# Patient Record
Sex: Female | Born: 1950 | Race: White | Hispanic: No | Smoking: Never smoker
Health system: Southern US, Community
[De-identification: ages and names within clinical notes are randomized; demographics above are authoritative.]

## PROBLEM LIST (undated history)

## (undated) HISTORY — PX: BACK SURGERY: SHX140

---

## 1998-10-17 ENCOUNTER — Other Ambulatory Visit: Admission: RE | Admit: 1998-10-17 | Discharge: 1998-10-17 | Payer: Self-pay | Admitting: Gynecology

## 1999-09-04 ENCOUNTER — Other Ambulatory Visit: Admission: RE | Admit: 1999-09-04 | Discharge: 1999-09-04 | Payer: Self-pay | Admitting: Gynecology

## 1999-09-04 ENCOUNTER — Encounter: Admission: RE | Admit: 1999-09-04 | Discharge: 1999-09-04 | Payer: Self-pay | Admitting: Gynecology

## 1999-09-04 ENCOUNTER — Encounter: Payer: Self-pay | Admitting: Gynecology

## 1999-09-21 ENCOUNTER — Ambulatory Visit (HOSPITAL_COMMUNITY): Admission: RE | Admit: 1999-09-21 | Discharge: 1999-09-21 | Payer: Self-pay | Admitting: Surgery

## 1999-09-21 ENCOUNTER — Encounter: Payer: Self-pay | Admitting: Surgery

## 2000-04-01 ENCOUNTER — Other Ambulatory Visit: Admission: RE | Admit: 2000-04-01 | Discharge: 2000-04-01 | Payer: Self-pay | Admitting: Gynecology

## 2000-05-14 ENCOUNTER — Encounter: Admission: RE | Admit: 2000-05-14 | Discharge: 2000-05-14 | Payer: Self-pay | Admitting: Surgery

## 2000-05-14 ENCOUNTER — Encounter: Payer: Self-pay | Admitting: Surgery

## 2002-04-05 ENCOUNTER — Other Ambulatory Visit: Admission: RE | Admit: 2002-04-05 | Discharge: 2002-04-05 | Payer: Self-pay | Admitting: Gynecology

## 2002-08-30 ENCOUNTER — Encounter: Admission: RE | Admit: 2002-08-30 | Discharge: 2002-08-30 | Payer: Self-pay | Admitting: Gynecology

## 2002-08-30 ENCOUNTER — Encounter: Payer: Self-pay | Admitting: Gynecology

## 2003-06-27 ENCOUNTER — Other Ambulatory Visit: Admission: RE | Admit: 2003-06-27 | Discharge: 2003-06-27 | Payer: Self-pay | Admitting: Gynecology

## 2004-06-26 ENCOUNTER — Other Ambulatory Visit: Admission: RE | Admit: 2004-06-26 | Discharge: 2004-06-26 | Payer: Self-pay | Admitting: Gynecology

## 2005-07-12 ENCOUNTER — Other Ambulatory Visit: Admission: RE | Admit: 2005-07-12 | Discharge: 2005-07-12 | Payer: Self-pay | Admitting: Gynecology

## 2006-09-21 ENCOUNTER — Encounter: Admission: RE | Admit: 2006-09-21 | Discharge: 2006-09-21 | Payer: Self-pay | Admitting: Internal Medicine

## 2006-09-30 ENCOUNTER — Encounter: Admission: RE | Admit: 2006-09-30 | Discharge: 2006-09-30 | Payer: Self-pay | Admitting: Internal Medicine

## 2006-10-08 ENCOUNTER — Other Ambulatory Visit: Admission: RE | Admit: 2006-10-08 | Discharge: 2006-10-08 | Payer: Self-pay | Admitting: Gynecology

## 2006-10-22 ENCOUNTER — Inpatient Hospital Stay (HOSPITAL_COMMUNITY): Admission: RE | Admit: 2006-10-22 | Discharge: 2006-10-25 | Payer: Self-pay | Admitting: Neurosurgery

## 2010-07-17 NOTE — Discharge Summary (Signed)
Alexandra Matthews             ACCOUNT NO.:  [id number]   MEDICAL RECORD NO.:  [id number]          PATIENT TYPE:  INP   LOCATION:  5153                         FACILITY:  [facility]   PHYSICIAN:  Danae Orleans. Venetia Maxon, M.D.  DATE OF BIRTH:  [birth year]   DATE OF ADMISSION:  10/22/2006  DATE OF DISCHARGE:  10/25/2006                               DISCHARGE SUMMARY   REASON FOR ADMISSION:  Alexandra Matthews is a 60 year old woman with back  and bilateral hip and leg pain consistent with neurogenic claudication.   HOSPITAL COURSE:  She failed medical management, was worked up with an  MRI scan of the lumbar spine which demonstrated spondylolisthesis of L4  and L5 with severe spinal stenosis.  It was elected for her to undergo  decompression and fusion at the L4-L5 level.  This was performed on  October 22, 2006.  She underwent uncomplicated surgery.  Postoperatively  she was gradually mobilized with physical therapy, and was doing well on  the 22nd.  On the 23rd she was up and ambulating, taking oral  analgesics, having good pain control, was using a rolling walker.   PLAN:  The plan is for her to be discharged home, to follow up with Dr.  Lovell Sheehan in the office in three weeks.   MEDICATIONS ON DISCHARGE:  1. Vivelle-Dot 0.05 mg one-half patch twice weekly.  2. Percocet 10/325 one every 4-6 hours as needed for pain.  3. Valium 5 mg every 6 hours as needed for muscular spasm.      Danae Orleans. Venetia Maxon, M.D.  Electronically Signed     JDS/MEDQ  D:  10/25/2006  T:  10/26/2006  Job:  [id number]

## 2010-07-17 NOTE — Op Note (Signed)
Alexandra Matthews, Alexandra Matthews             ACCOUNT NO.:  [id number]   MEDICAL RECORD NO.:  [id number]          PATIENT TYPE:  INP   LOCATION:  5153                         FACILITY:  [facility]   PHYSICIAN:  Cristi Loron, M.D.DATE OF BIRTH:  [birth year]   DATE OF PROCEDURE:  10/22/2006  DATE OF DISCHARGE:                               OPERATIVE REPORT   BRIEF HISTORY:  The patient is a 60 year old white female who suffered  from back and bilateral hip and leg pain consistent with neurogenic  claudication.  She failed medical management and was worked up with a  lumbar MRI which demonstrated the patient had a spondylolisthesis at L4-  L5 with severe spinal stenosis.  I discussed the various treatment  options with the patient.  She has weighed the risks, benefits and  alternatives of surgery and has decided to proceed with an L4-L5  decompression, instrumentation, and fusion.   PREOPERATIVE DIAGNOSIS:  L4-L5 grade 1 acquired spondylolisthesis,  superior facet arthropathy, spinal stenosis, spondylosis, degenerative  disc disease, lumbar radiculopathy, lumbago, lumbar scoliosis.   POSTOPERATIVE DIAGNOSIS:  L4-L5 grade 1 acquired spondylolisthesis,  superior facet arthropathy, spinal stenosis, spondylosis, degenerative  disc disease, lumbar radiculopathy, lumbago, lumbar scoliosis.   PROCEDURE PERFORMED:  Bilateral laminotomies and foraminotomies at L4 to  treat the severe spinal stenosis at L4-L5; L4-L5 transforaminal lumbar  interbody fusion with local morselized autograft bone and VITOSS bone  graft extender; insertion of L4-L5 interbody prosthesis (Capstone PEEK  interbody prosthesis); L4-L5 posterior nonsegmental instrumentation with  pedicle screws with Legacy titanium pedicle screws and rods; L4-L5  posterolateral arthrodesis with local morselized autograft bone and  VITOSS bone graft extender.   SURGEON:  Cristi Loron, M.D.   ASSISTANT:  Stefani Dama, M.D.   ANESTHESIA:   General endotracheal anesthesia.   ESTIMATED BLOOD LOSS:  250 mL.   SPECIMENS:  None.   DRAINS:  None.   COMPLICATIONS:  None.   DESCRIPTION OF PROCEDURE:  The patient was brought to the operating room  by the anesthesia team.  General endotracheal anesthesia was induced.  The patient was turned to the prone position on the Wilson frame.  The  lumbosacral region was then prepared with Betadine scrub and Betadine  solution.  Sterile drapes were applied.  I then injected the area to be  incised with Marcaine with epinephrine solution and used a scalpel to  make a linear midline incision over the L4-L5 interspace.  I used  electrocautery to perform a bilateral subperiosteal dissection exposing  the spinous process and lamina of L3, L4, L5, and the upper sacrum.  We  then obtained intraoperative radiograph to confirm our location and then  inserted the [facility] retractor for exposure.  We then began the  decompression by using a high speed drill to perform bilateral L4  laminotomies.  We widened the laminotomies with a Kerrison punch and  performed a foraminotomy about both the L4 and L5 nerve roots. Of note,  this decompression was in excess of what was needed to do the interbody  fusion, i.e., we had to perform the foraminotomies and medial  facetectomy  because of the severe stenosis.   Having completed the decompression, we now turned attention to  arthrodesis.  I removed the inferior facet of L4 on the right to gain  access to the more lateral aspects to the L4-L5 intervertebral disc  space.  We then incised the L4-L5 intervertebral disc and performed a  partial intervertebral discectomy using pituitary forceps and the  Epstein and Scoville curets.  We then prepared the vertebral endplates  for fusion by removing the soft tissue using the curets.  We used the  trial spacers and determined to use a 12 x 26 mm Capstone PEEK interbody  prosthesis.  We prefilled this prosthesis with  local autograft bone and  VITOSS as well as the anterior and posterior disc space with VITOSS and  local autograft bone. We inserted the prosthesis into the interspace, of  course, after retracting the neural structures out of harm's way.  We  turned partially sideways and noted there was a good snug fit of the  prosthesis in the interspace.  We filled the remainder of the interspace  with local autograft bone and VITOSS completing the transforaminal  lumbar interbody fusion.   We now turned attention to the instrumentation. Under fluoroscopic  guidance, we cannulated the bilateral L4 and L5 pedicles with the bone  probe.  We tapped the pedicles with a 5.5 mm tap.  We probed inside the  tapped pedicles to rule out cortical breeches.  We then inserted 6.5 x  50 mm pedicle screws bilaterally at L5 and 6.5 by 55 mm bilaterally at  L4.  We did this under fluoroscopic guidance. We then palpated along the  medial surface of the L4 and L5 pedicles and noted there was no cortical  breeches and the L4 and L5 nerve roots were not injured.  We then  connected the unilateral pedicle screws with a lordotic rod.  We  compressed the construct and then secured the rod in place by placing  the caps which we tightened appropriately completing the  instrumentation.   We now turned attention to posterolateral arthrodesis.  We used a high  speed drill to decorticate the remainder of the left L4-L5 facet pars  and transverse processes as well as the remainder of the superior facet  at L5 and the transverse process on the right.  We then laid a  combination of local morselized autograft bone and VITOSS bone graft  extender over these decorticated posterolateral structures completing  the posterolateral arthrodesis.   We then inspected the thecal sac and bilateral L4 and L5 nerve roots and  noted they were well decompressed.  We obtained hemostasis using bipolar  cautery.  We irrigated the wound out with  bacitracin solution and  removed the retractor.  We then reapproximated the patient's  thoracolumbar fascia with interrupted #1 Vicryl suture, subcutaneous  tissue with interrupted 2-0 Vicryl suture, and skin with Steri-Strips  and Benzoin.  The wound was then coated with bacitracin ointment, a  sterile dressing was applied, the drapes were removed.  The patient was  subsequently returned to the supine position where she was extubated by  the anesthesia team and transported to the post anesthesia care unit in  stable condition.  All sponge, instrument and needle counts were correct  at the end of the case.  I should mention that we also removed the  cephalad aspect of the L5 lamina to further decompress the L4-L5  interspace bilaterally.      Duane Lope.  Lovell Sheehan, M.D.  Electronically Signed     JDJ/MEDQ  D:  10/22/2006  T:  10/23/2006  Job:  [id number]

## 2010-12-14 LAB — COMPREHENSIVE METABOLIC PANEL
AST: 22
Albumin: 4.2
BUN: 15
CO2: 30
Calcium: 9.7
Chloride: 108
Creatinine, Ser: 1.01
GFR calc Af Amer: >60
GFR calc non Af Amer: 57 — ABNORMAL LOW
Total Bilirubin: 1.1

## 2010-12-14 LAB — CBC
HCT: 31.6 — ABNORMAL LOW
Hemoglobin: 10.8 — ABNORMAL LOW
MCV: 95.1
Platelets: 243
RBC: 4.3
WBC: 4.6
WBC: 6.2

## 2010-12-14 LAB — TYPE AND SCREEN
ABO/RH(D): O POS
Antibody Screen: NEGATIVE

## 2010-12-14 LAB — ABO/RH: ABO/RH(D): O POS

## 2010-12-14 LAB — BASIC METABOLIC PANEL
BUN: 12
Chloride: 101
GFR calc non Af Amer: >60
Potassium: 4.1
Sodium: 137

## 2011-06-12 ENCOUNTER — Other Ambulatory Visit: Payer: Self-pay | Admitting: Internal Medicine

## 2011-06-12 DIAGNOSIS — Z1231 Encounter for screening mammogram for malignant neoplasm of breast: Secondary | ICD-10-CM

## 2011-06-20 ENCOUNTER — Ambulatory Visit
Admission: RE | Admit: 2011-06-20 | Discharge: 2011-06-20 | Disposition: A | Discharge status: Home / Self Care | Payer: BC Managed Care – PPO | Source: Ambulatory Visit | Attending: Internal Medicine | Admitting: Internal Medicine

## 2011-06-20 DIAGNOSIS — Z1231 Encounter for screening mammogram for malignant neoplasm of breast: Secondary | ICD-10-CM

## 2012-05-20 ENCOUNTER — Other Ambulatory Visit: Payer: Self-pay | Admitting: Obstetrics and Gynecology

## 2012-05-20 ENCOUNTER — Other Ambulatory Visit (HOSPITAL_COMMUNITY)
Admission: RE | Admit: 2012-05-20 | Discharge: 2012-05-20 | Disposition: A | Discharge status: Home / Self Care | Payer: BC Managed Care – PPO | Source: Ambulatory Visit | Attending: Obstetrics and Gynecology | Admitting: Obstetrics and Gynecology

## 2012-05-20 DIAGNOSIS — Z1151 Encounter for screening for human papillomavirus (HPV): Secondary | ICD-10-CM | POA: Insufficient documentation

## 2012-05-20 DIAGNOSIS — Z01419 Encounter for gynecological examination (general) (routine) without abnormal findings: Secondary | ICD-10-CM | POA: Insufficient documentation

## 2014-06-28 ENCOUNTER — Other Ambulatory Visit: Payer: Self-pay

## 2014-06-28 DIAGNOSIS — Z1231 Encounter for screening mammogram for malignant neoplasm of breast: Secondary | ICD-10-CM

## 2014-07-04 ENCOUNTER — Encounter (INDEPENDENT_AMBULATORY_CARE_PROVIDER_SITE_OTHER): Payer: Self-pay

## 2014-07-04 ENCOUNTER — Ambulatory Visit
Admission: RE | Admit: 2014-07-04 | Discharge: 2014-07-04 | Disposition: A | Discharge status: Home / Self Care | Payer: BC Managed Care – PPO | Source: Ambulatory Visit

## 2014-07-04 DIAGNOSIS — Z1231 Encounter for screening mammogram for malignant neoplasm of breast: Secondary | ICD-10-CM

## 2017-06-26 ENCOUNTER — Other Ambulatory Visit: Payer: Self-pay | Admitting: Internal Medicine

## 2017-06-26 DIAGNOSIS — Z1231 Encounter for screening mammogram for malignant neoplasm of breast: Secondary | ICD-10-CM

## 2017-07-15 ENCOUNTER — Ambulatory Visit
Admission: RE | Admit: 2017-07-15 | Discharge: 2017-07-15 | Disposition: A | Discharge status: Home / Self Care | Payer: Medicare Other | Source: Ambulatory Visit | Attending: Internal Medicine | Admitting: Internal Medicine

## 2017-07-15 DIAGNOSIS — Z1231 Encounter for screening mammogram for malignant neoplasm of breast: Secondary | ICD-10-CM

## 2019-04-19 ENCOUNTER — Ambulatory Visit: Payer: Self-pay | Attending: Internal Medicine

## 2019-04-19 DIAGNOSIS — Z23 Encounter for immunization: Secondary | ICD-10-CM | POA: Insufficient documentation

## 2019-04-19 NOTE — Progress Notes (Signed)
   Covid-19 Vaccination Clinic  Name:  Alexandra Matthews    MRN: [id number] DOB: Aug 19, 1950  04/19/2019  [name] was observed post Covid-19 immunization for 15 minutes without incidence. She was provided with Vaccine Information Sheet and instruction to access the V-Safe system.   [name] was instructed to call 911 with any severe reactions post vaccine: Marland Kitchen Difficulty breathing  . Swelling of your face and throat  . A fast heartbeat  . A bad rash all over your body  . Dizziness and weakness    Immunizations Administered    Name Date Dose VIS Date Route   Moderna COVID-19 Vaccine 04/19/2019 11:28 AM 0.5 mL 02/02/2019 Intramuscular   Manufacturer: Moderna   Lot: [id number]   [id number]

## 2019-05-18 ENCOUNTER — Other Ambulatory Visit: Payer: Self-pay

## 2019-05-18 ENCOUNTER — Ambulatory Visit: Payer: Medicare PPO | Attending: Internal Medicine

## 2019-05-18 DIAGNOSIS — Z23 Encounter for immunization: Secondary | ICD-10-CM

## 2019-05-18 NOTE — Progress Notes (Signed)
   Covid-19 Vaccination Clinic  Name:  LENNIX LAMINACK    MRN: [id number] DOB: 05-05-1950  05/18/2019  Ms. Gislason was observed post Covid-19 immunization for 15 minutes without incident. She was provided with Vaccine Information Sheet and instruction to access the V-Safe system.   Ms. Troxell was instructed to call 911 with any severe reactions post vaccine: Marland Kitchen Difficulty breathing  . Swelling of face and throat  . A fast heartbeat  . A bad rash all over body  . Dizziness and weakness   Immunizations Administered    Name Date Dose VIS Date Route   Moderna COVID-19 Vaccine 05/18/2019 11:24 AM 0.5 mL 02/02/2019 Intramuscular   Manufacturer: Moderna   Lot: [id number]   [id number]

## 2019-08-06 ENCOUNTER — Other Ambulatory Visit: Payer: Self-pay | Admitting: Internal Medicine

## 2019-08-06 DIAGNOSIS — M8588 Other specified disorders of bone density and structure, other site: Secondary | ICD-10-CM

## 2019-08-06 DIAGNOSIS — Z1231 Encounter for screening mammogram for malignant neoplasm of breast: Secondary | ICD-10-CM

## 2019-09-07 DIAGNOSIS — E039 Hypothyroidism, unspecified: Secondary | ICD-10-CM | POA: Diagnosis not present

## 2019-09-13 DIAGNOSIS — H10413 Chronic giant papillary conjunctivitis, bilateral: Secondary | ICD-10-CM | POA: Diagnosis not present

## 2019-09-13 DIAGNOSIS — H04123 Dry eye syndrome of bilateral lacrimal glands: Secondary | ICD-10-CM | POA: Diagnosis not present

## 2019-10-26 ENCOUNTER — Ambulatory Visit
Admission: RE | Admit: 2019-10-26 | Discharge: 2019-10-26 | Disposition: A | Discharge status: Home / Self Care | Payer: Medicare PPO | Source: Ambulatory Visit | Attending: Internal Medicine | Admitting: Internal Medicine

## 2019-10-26 ENCOUNTER — Other Ambulatory Visit: Payer: Self-pay

## 2019-10-26 DIAGNOSIS — Z1231 Encounter for screening mammogram for malignant neoplasm of breast: Secondary | ICD-10-CM | POA: Diagnosis not present

## 2019-10-26 DIAGNOSIS — Z780 Asymptomatic menopausal state: Secondary | ICD-10-CM | POA: Diagnosis not present

## 2019-10-26 DIAGNOSIS — M8589 Other specified disorders of bone density and structure, multiple sites: Secondary | ICD-10-CM | POA: Diagnosis not present

## 2019-10-26 DIAGNOSIS — M8588 Other specified disorders of bone density and structure, other site: Secondary | ICD-10-CM

## 2020-08-22 DIAGNOSIS — M8580 Other specified disorders of bone density and structure, unspecified site: Secondary | ICD-10-CM | POA: Diagnosis not present

## 2020-08-22 DIAGNOSIS — Z1389 Encounter for screening for other disorder: Secondary | ICD-10-CM | POA: Diagnosis not present

## 2020-08-22 DIAGNOSIS — Z0000 Encounter for general adult medical examination without abnormal findings: Secondary | ICD-10-CM | POA: Diagnosis not present

## 2020-08-22 DIAGNOSIS — E039 Hypothyroidism, unspecified: Secondary | ICD-10-CM | POA: Diagnosis not present

## 2020-08-22 DIAGNOSIS — L989 Disorder of the skin and subcutaneous tissue, unspecified: Secondary | ICD-10-CM | POA: Diagnosis not present

## 2020-08-22 DIAGNOSIS — Z5181 Encounter for therapeutic drug level monitoring: Secondary | ICD-10-CM | POA: Diagnosis not present

## 2020-08-22 DIAGNOSIS — E7800 Pure hypercholesterolemia, unspecified: Secondary | ICD-10-CM | POA: Diagnosis not present

## 2021-04-06 DIAGNOSIS — Z85828 Personal history of other malignant neoplasm of skin: Secondary | ICD-10-CM | POA: Diagnosis not present

## 2021-04-06 DIAGNOSIS — D045 Carcinoma in situ of skin of trunk: Secondary | ICD-10-CM | POA: Diagnosis not present

## 2021-04-06 DIAGNOSIS — L718 Other rosacea: Secondary | ICD-10-CM | POA: Diagnosis not present

## 2021-04-06 DIAGNOSIS — L570 Actinic keratosis: Secondary | ICD-10-CM | POA: Diagnosis not present

## 2021-04-06 DIAGNOSIS — C44321 Squamous cell carcinoma of skin of nose: Secondary | ICD-10-CM | POA: Diagnosis not present

## 2021-04-06 DIAGNOSIS — L720 Epidermal cyst: Secondary | ICD-10-CM | POA: Diagnosis not present

## 2021-05-22 DIAGNOSIS — R221 Localized swelling, mass and lump, neck: Secondary | ICD-10-CM | POA: Diagnosis not present

## 2021-05-23 ENCOUNTER — Other Ambulatory Visit: Payer: Self-pay | Admitting: Internal Medicine

## 2021-05-23 DIAGNOSIS — R221 Localized swelling, mass and lump, neck: Secondary | ICD-10-CM

## 2021-05-24 ENCOUNTER — Ambulatory Visit
Admission: RE | Admit: 2021-05-24 | Discharge: 2021-05-24 | Disposition: A | Discharge status: Home / Self Care | Payer: Medicare PPO | Source: Ambulatory Visit | Attending: Internal Medicine | Admitting: Internal Medicine

## 2021-05-24 DIAGNOSIS — R221 Localized swelling, mass and lump, neck: Secondary | ICD-10-CM | POA: Diagnosis not present

## 2021-05-24 DIAGNOSIS — M47812 Spondylosis without myelopathy or radiculopathy, cervical region: Secondary | ICD-10-CM | POA: Diagnosis not present

## 2021-05-29 DIAGNOSIS — R896 Abnormal cytological findings in specimens from other organs, systems and tissues: Secondary | ICD-10-CM | POA: Diagnosis not present

## 2021-05-29 DIAGNOSIS — R221 Localized swelling, mass and lump, neck: Secondary | ICD-10-CM | POA: Diagnosis not present

## 2021-06-14 ENCOUNTER — Other Ambulatory Visit (HOSPITAL_COMMUNITY): Payer: Self-pay | Admitting: Otolaryngology

## 2021-06-14 ENCOUNTER — Other Ambulatory Visit: Payer: Self-pay | Admitting: Otolaryngology

## 2021-06-14 DIAGNOSIS — C770 Secondary and unspecified malignant neoplasm of lymph nodes of head, face and neck: Secondary | ICD-10-CM

## 2021-06-27 ENCOUNTER — Ambulatory Visit (HOSPITAL_COMMUNITY)
Admission: RE | Admit: 2021-06-27 | Discharge: 2021-06-27 | Disposition: A | Discharge status: Home / Self Care | Payer: Medicare PPO | Source: Ambulatory Visit | Attending: Otolaryngology | Admitting: Otolaryngology

## 2021-06-27 DIAGNOSIS — C770 Secondary and unspecified malignant neoplasm of lymph nodes of head, face and neck: Secondary | ICD-10-CM | POA: Diagnosis not present

## 2021-06-27 DIAGNOSIS — J984 Other disorders of lung: Secondary | ICD-10-CM | POA: Diagnosis not present

## 2021-06-27 DIAGNOSIS — R590 Localized enlarged lymph nodes: Secondary | ICD-10-CM | POA: Diagnosis not present

## 2021-06-27 DIAGNOSIS — K769 Liver disease, unspecified: Secondary | ICD-10-CM | POA: Diagnosis not present

## 2021-06-27 LAB — GLUCOSE, CAPILLARY: Glucose-Capillary: 86 mg/dL (ref 70–99)

## 2021-06-27 MED ORDER — FLUDEOXYGLUCOSE F - 18 (FDG) INJECTION
6.8900 | Freq: Once | INTRAVENOUS | Status: AC
  Administered 2021-06-27: 6.89 via INTRAVENOUS

## 2021-07-09 DIAGNOSIS — C44529 Squamous cell carcinoma of skin of other part of trunk: Secondary | ICD-10-CM | POA: Diagnosis not present

## 2021-07-09 DIAGNOSIS — Z85828 Personal history of other malignant neoplasm of skin: Secondary | ICD-10-CM | POA: Diagnosis not present

## 2021-07-09 DIAGNOSIS — L570 Actinic keratosis: Secondary | ICD-10-CM | POA: Diagnosis not present

## 2021-07-16 NOTE — H&P (Signed)
HPI:  ? ?Alexandra Matthews is a 71 y.o. female who presents as a consult Patient.  ? ?Referring Provider: Burnard Leigh, * ? ?Chief complaint: Neck mass. ? ?HPI: Very healthy and active lady, noticed a lump in her neck a few months ago. She underwent CT scan recently which was concerning for possible metastatic cancer. She has no symptoms related to this whatsoever. She has never smoked or drink. ? ?PMH/Meds/All/SocHx/FamHx/ROS:  ? ?History reviewed. No pertinent past medical history. ? ?History reviewed. No pertinent surgical history. ? ?No family history of bleeding disorders, wound healing problems or difficulty with anesthesia.  ? ?Social History  ? ?Socioeconomic History  ? Marital status: Divorced  ?Spouse name: Not on file  ? Number of children: Not on file  ? Years of education: Not on file  ? Highest education level: Not on file  ?Occupational History  ? Not on file  ?Tobacco Use  ? Smoking status: Never  ? Smokeless tobacco: Never  ?Substance and Sexual Activity  ? Alcohol use: Yes  ? Drug use: Not on file  ? Sexual activity: Not on file  ?Other Topics Concern  ? Not on file  ?Social History Narrative  ? Not on file  ? ?Social Determinants of Health  ? ?Financial Resource Strain: Not on file  ?Food Insecurity: Not on file  ?Transportation Needs: Not on file  ?Physical Activity: Not on file  ?Stress: Not on file  ?Social Connections: Not on file  ?Housing Stability: Not on file  ? ?Current Outpatient Medications:  ? fluticasone propionate (FLONASE ALLERGY RELIEF) 50 mcg/actuation nasal spray, 1 spray in each nostril, Disp: , Rfl:  ? levothyroxine (SYNTHROID) 50 MCG tablet, levothyroxine 50 mcg tablet, Disp: , Rfl:  ? ?A complete ROS was performed with pertinent positives/negatives noted in the HPI. The remainder of the ROS are negative. ? ? ?Physical Exam:  ? ?Temp 97.8 ?F (36.6 ?C)  Ht 1.651 m ('5\' 5"'$ )  Wt 64.4 kg (142 lb)  BMI 23.63 kg/m?  ? ?General: Healthy and alert, in no distress, breathing  easily. Normal affect. In a pleasant mood. ?Head: Normocephalic, atraumatic. No masses, or scars. ?Eyes: Pupils are equal, and reactive to light. Vision is grossly intact. No spontaneous or gaze nystagmus. ?Ears: Ear canals are clear. Tympanic membranes are intact, with normal landmarks and the middle ears are clear and healthy. ?Hearing: Grossly normal. ?Nose: Nasal cavities are clear with healthy mucosa, no polyps or exudate. Airways are patent. ?Face: No masses or scars, facial nerve function is symmetric. ?Oral Cavity: No mucosal abnormalities are noted. Tongue with normal mobility. Dentition appears healthy. ?Oropharynx: Tonsils are symmetric. There are no mucosal masses identified. Tongue base appears normal and healthy. ?Larynx/Hypopharynx: indirect exam reveals healthy, mobile vocal cords, without mucosal lesions in the hypopharynx or larynx. ?Chest: Deferred ?Neck: 3 cm soft oval-shaped mass right level 2, otherwise no palpable masses, no cervical adenopathy, no thyroid nodules or enlargement. ?Neuro: Cranial nerves II-XII with normal function. ?Balance: Normal gate. ?Other findings: none. ? ?Independent Review of Additional Tests or Records:  ?CT neck: ? ?IMPRESSION:  ?Mixed cystic and solid mass right lateral neck, most likely a  ?pathologic lymph node due to metastatic disease. Tissue sampling  ?recommended. Direct mucosal evaluation recommended. Mild fullness of  ?the tonsil bilaterally without definite mass.  ? ?Procedures:  ?Procedure Note: ? ?Indications for procedure: neck mass ? ?Details of the procedure were discussed with the patient and all questions were answered. ? ?Procedure: ? ?2% xylocaine  with epinephrine was infiltrated into the overlying skin. ?First pass was made with a 25 gauge needle and 10 cc syringe. Second pass was made with a 22 gauge needle. Specimen was placed on microscopic slides and air-dried. Additional material was placed in Cytolyte solution for cell block preparation. A  third pass was made with a 22 guage needle and sample was added to the cytolyte solution. ? ?A bandage was applied.  ? ?She tolerated the procedure well. Results will be discussed when available. ? ?Impression & Plans:  ?Right-sided neck mass, possibly cystic. This could represent a branchial cleft cyst. 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 FNA performed. We will discuss results when available. ?

## 2021-07-17 ENCOUNTER — Encounter (HOSPITAL_COMMUNITY): Payer: Self-pay | Admitting: Otolaryngology

## 2021-07-17 ENCOUNTER — Other Ambulatory Visit: Payer: Self-pay

## 2021-07-17 NOTE — Progress Notes (Signed)
PCP - Dr. Delfina Redwood ?Cardiologist - denies ?EKG -  ? ?ERAS Protcol -  ?COVID TEST-  ? ?Anesthesia review: n/a ? ?------------- ? ?SDW INSTRUCTIONS: ? ?Your procedure is scheduled on 5/17. Please report to [facility] Main Entrance "A" at 1000 A.M., and check in at the Admitting office. Call this number if you have problems the morning of surgery: [phone] ? ? ?Remember: Do not eat after midnight the night before your surgery ? ?You may drink clear liquids until 09:30 AM the morning of your surgery.   ?Clear liquids allowed are: Water, Non-Citrus Juices (without pulp), Carbonated Beverages, Clear Tea, Black Coffee Only, and Gatorade ?  ?Medications to take morning of surgery with a sip of water include: ?Synthroid ? ?As of today, STOP taking any Aspirin (unless otherwise instructed by your surgeon), Aleve, Naproxen, Ibuprofen, Motrin, Advil, Goody's, BC's, all herbal medications, fish oil, and all vitamins. ? ?  ?The Morning of Surgery ?Do not wear jewelry, make-up or nail polish. ?Do not wear lotions, powders, or perfumes, or deodorant ?Do not bring valuables to the hospital. ?[facility]  is not responsible for any belongings or valuables. ? ?If you are a smoker, DO NOT Smoke 24 hours prior to surgery ? ?If you wear a CPAP at night please bring your mask the morning of surgery  ? ?Remember that you must have someone to transport you home after your surgery, and remain with you for 24 hours if you are discharged the same day. ? ?Please bring cases for contacts, glasses, hearing aids, dentures or bridgework because it cannot be worn into surgery.  ? ?Patients discharged the day of surgery will not be allowed to drive home.  ? ?Please shower the NIGHT BEFORE/MORNING OF SURGERY (use antibacterial soap like DIAL soap if possible). Wear comfortable clothes the morning of surgery. Oral Hygiene is also important to reduce your risk of infection.  Remember - BRUSH YOUR TEETH THE MORNING OF SURGERY WITH YOUR REGULAR  TOOTHPASTE ? ?Patient denies shortness of breath, fever, cough and chest pain.  ? ? ?   ? ?

## 2021-07-18 ENCOUNTER — Ambulatory Visit (HOSPITAL_COMMUNITY)
Admission: RE | Admit: 2021-07-18 | Discharge: 2021-07-18 | Disposition: A | Discharge status: Home / Self Care | Payer: Medicare PPO | Attending: Otolaryngology | Admitting: Otolaryngology

## 2021-07-18 ENCOUNTER — Encounter (HOSPITAL_COMMUNITY): Payer: Self-pay | Admitting: Otolaryngology

## 2021-07-18 ENCOUNTER — Encounter (HOSPITAL_COMMUNITY)
Admission: RE | Disposition: A | Discharge status: Home / Self Care | Payer: Self-pay | Source: Home / Self Care | Attending: Otolaryngology

## 2021-07-18 ENCOUNTER — Ambulatory Visit (HOSPITAL_COMMUNITY): Payer: Medicare PPO | Admitting: Anesthesiology

## 2021-07-18 ENCOUNTER — Other Ambulatory Visit: Payer: Self-pay

## 2021-07-18 ENCOUNTER — Ambulatory Visit (HOSPITAL_BASED_OUTPATIENT_CLINIC_OR_DEPARTMENT_OTHER): Payer: Medicare PPO | Admitting: Anesthesiology

## 2021-07-18 DIAGNOSIS — C099 Malignant neoplasm of tonsil, unspecified: Secondary | ICD-10-CM | POA: Insufficient documentation

## 2021-07-18 DIAGNOSIS — C801 Malignant (primary) neoplasm, unspecified: Secondary | ICD-10-CM | POA: Diagnosis not present

## 2021-07-18 DIAGNOSIS — C7989 Secondary malignant neoplasm of other specified sites: Secondary | ICD-10-CM

## 2021-07-18 DIAGNOSIS — Z20822 Contact with and (suspected) exposure to covid-19: Secondary | ICD-10-CM | POA: Diagnosis not present

## 2021-07-18 DIAGNOSIS — Z9089 Acquired absence of other organs: Secondary | ICD-10-CM

## 2021-07-18 DIAGNOSIS — R221 Localized swelling, mass and lump, neck: Secondary | ICD-10-CM | POA: Diagnosis not present

## 2021-07-18 HISTORY — PX: TONSILLECTOMY: SHX5217

## 2021-07-18 HISTORY — PX: LARYNGOSCOPY AND ESOPHAGOSCOPY: SHX5660

## 2021-07-18 LAB — CBC
HCT: 44.1 % (ref 36.0–46.0)
Hemoglobin: 14.7 g/dL (ref 12.0–15.0)
MCH: 31.1 pg (ref 26.0–34.0)
MCHC: 33.3 g/dL (ref 30.0–36.0)
MCV: 93.4 fL (ref 80.0–100.0)
Platelets: 188 10*3/uL (ref 150–400)
RBC: 4.72 MIL/uL (ref 3.87–5.11)
RDW: 12.8 % (ref 11.5–15.5)
WBC: 4.3 10*3/uL (ref 4.0–10.5)
nRBC: 0 % (ref 0.0–0.2)

## 2021-07-18 LAB — SARS CORONAVIRUS 2 BY RT PCR: SARS Coronavirus 2 by RT PCR: NEGATIVE

## 2021-07-18 SURGERY — LARYNGOSCOPY, WITH ESOPHAGOSCOPY
Anesthesia: General | Site: Throat

## 2021-07-18 MED ORDER — DEXMEDETOMIDINE (PRECEDEX) IN NS 20 MCG/5ML (4 MCG/ML) IV SYRINGE
PREFILLED_SYRINGE | INTRAVENOUS | Status: DC | PRN
  Administered 2021-07-18: 4 ug via INTRAVENOUS
  Administered 2021-07-18 (×2): 8 ug via INTRAVENOUS

## 2021-07-18 MED ORDER — OXYCODONE HCL 5 MG PO TABS: 5.0000 mg | ORAL_TABLET | Freq: Once | ORAL | Status: AC | PRN

## 2021-07-18 MED ORDER — HYDROMORPHONE HCL 1 MG/ML IJ SOLN
0.2500 mg | INTRAMUSCULAR | Status: DC | PRN
  Administered 2021-07-18 (×4): 0.25 mg via INTRAVENOUS

## 2021-07-18 MED ORDER — 0.9 % SODIUM CHLORIDE (POUR BTL) OPTIME
TOPICAL | Status: DC | PRN
  Administered 2021-07-18: 1000 mL

## 2021-07-18 MED ORDER — HYDROMORPHONE HCL 1 MG/ML IJ SOLN
INTRAMUSCULAR | Status: AC
  Filled 2021-07-18: qty 1

## 2021-07-18 MED ORDER — OXYMETAZOLINE HCL 0.05 % NA SOLN
NASAL | Status: AC
  Filled 2021-07-18: qty 30

## 2021-07-18 MED ORDER — CHLORHEXIDINE GLUCONATE 0.12 % MT SOLN
15.0000 mL | Freq: Once | OROMUCOSAL | Status: AC
  Administered 2021-07-18: 15 mL via OROMUCOSAL
  Filled 2021-07-18: qty 15

## 2021-07-18 MED ORDER — PROPOFOL 10 MG/ML IV BOLUS
INTRAVENOUS | Status: DC | PRN
  Administered 2021-07-18: 150 mg via INTRAVENOUS

## 2021-07-18 MED ORDER — EPINEPHRINE HCL (NASAL) 0.1 % NA SOLN
NASAL | Status: AC
  Filled 2021-07-18: qty 30

## 2021-07-18 MED ORDER — DEXAMETHASONE SODIUM PHOSPHATE 10 MG/ML IJ SOLN
INTRAMUSCULAR | Status: DC | PRN
  Administered 2021-07-18: 10 mg via INTRAVENOUS

## 2021-07-18 MED ORDER — FENTANYL CITRATE (PF) 250 MCG/5ML IJ SOLN
INTRAMUSCULAR | Status: DC | PRN
  Administered 2021-07-18: 50 ug via INTRAVENOUS
  Administered 2021-07-18: 100 ug via INTRAVENOUS

## 2021-07-18 MED ORDER — ROCURONIUM BROMIDE 10 MG/ML (PF) SYRINGE
PREFILLED_SYRINGE | INTRAVENOUS | Status: DC | PRN
  Administered 2021-07-18: 60 mg via INTRAVENOUS

## 2021-07-18 MED ORDER — PROPOFOL 10 MG/ML IV BOLUS
INTRAVENOUS | Status: AC
  Filled 2021-07-18: qty 20

## 2021-07-18 MED ORDER — OXYCODONE HCL 5 MG/5ML PO SOLN
5.0000 mg | Freq: Once | ORAL | Status: AC | PRN
  Administered 2021-07-18: 5 mg via ORAL

## 2021-07-18 MED ORDER — PHENYLEPHRINE 80 MCG/ML (10ML) SYRINGE FOR IV PUSH (FOR BLOOD PRESSURE SUPPORT)
PREFILLED_SYRINGE | INTRAVENOUS | Status: DC | PRN
  Administered 2021-07-18: 80 ug via INTRAVENOUS
  Administered 2021-07-18: 160 ug via INTRAVENOUS
  Administered 2021-07-18: 240 ug via INTRAVENOUS

## 2021-07-18 MED ORDER — SUGAMMADEX SODIUM 200 MG/2ML IV SOLN
INTRAVENOUS | Status: DC | PRN
Start: 2021-07-18 — End: 2021-07-18
  Administered 2021-07-18: 200 mg via INTRAVENOUS

## 2021-07-18 MED ORDER — OXYCODONE HCL 5 MG/5ML PO SOLN
ORAL | Status: AC
  Filled 2021-07-18: qty 5

## 2021-07-18 MED ORDER — PHENYLEPHRINE HCL-NACL 20-0.9 MG/250ML-% IV SOLN
INTRAVENOUS | Status: DC | PRN
Start: 2021-07-18 — End: 2021-07-18
  Administered 2021-07-18: 30 ug/min via INTRAVENOUS

## 2021-07-18 MED ORDER — ORAL CARE MOUTH RINSE: 15.0000 mL | Freq: Once | OROMUCOSAL | Status: AC

## 2021-07-18 MED ORDER — HYDROCODONE-ACETAMINOPHEN 7.5-325 MG/15ML PO SOLN: 15.0000 mL | Freq: Four times a day (QID) | ORAL | 0 refills | Status: AC | PRN

## 2021-07-18 MED ORDER — ROCURONIUM BROMIDE 10 MG/ML (PF) SYRINGE
PREFILLED_SYRINGE | INTRAVENOUS | Status: AC
  Filled 2021-07-18: qty 10

## 2021-07-18 MED ORDER — LACTATED RINGERS IV SOLN: INTRAVENOUS | Status: DC

## 2021-07-18 MED ORDER — PHENYLEPHRINE HCL (PRESSORS) 10 MG/ML IV SOLN
INTRAVENOUS | Status: AC
  Filled 2021-07-18: qty 1

## 2021-07-18 MED ORDER — PHENYLEPHRINE 80 MCG/ML (10ML) SYRINGE FOR IV PUSH (FOR BLOOD PRESSURE SUPPORT)
PREFILLED_SYRINGE | INTRAVENOUS | Status: AC
  Filled 2021-07-18: qty 10

## 2021-07-18 MED ORDER — LIDOCAINE 2% (20 MG/ML) 5 ML SYRINGE
INTRAMUSCULAR | Status: DC | PRN
  Administered 2021-07-18: 60 mg via INTRAVENOUS

## 2021-07-18 MED ORDER — FENTANYL CITRATE (PF) 250 MCG/5ML IJ SOLN
INTRAMUSCULAR | Status: AC
  Filled 2021-07-18: qty 5

## 2021-07-18 MED ORDER — ONDANSETRON HCL 4 MG/2ML IJ SOLN
INTRAMUSCULAR | Status: DC | PRN
  Administered 2021-07-18: 4 mg via INTRAVENOUS

## 2021-07-18 MED ORDER — EPINEPHRINE HCL (NASAL) 0.1 % NA SOLN
NASAL | Status: DC | PRN
Start: 2021-07-18 — End: 2021-07-18
  Administered 2021-07-18: 30 mL via TOPICAL

## 2021-07-18 MED ORDER — EPHEDRINE 5 MG/ML INJ
INTRAVENOUS | Status: AC
  Filled 2021-07-18: qty 5

## 2021-07-18 MED ORDER — ONDANSETRON 4 MG PO TBDP: 4.0000 mg | ORAL_TABLET | Freq: Three times a day (TID) | ORAL | 0 refills | Status: AC | PRN

## 2021-07-18 MED ORDER — ONDANSETRON HCL 4 MG/2ML IJ SOLN: 4.0000 mg | Freq: Once | INTRAMUSCULAR | Status: DC | PRN

## 2021-07-18 SURGICAL SUPPLY — 40 items
BAG COUNTER SPONGE SURGICOUNT (BAG) ×3 IMPLANT
BLADE SURG 15 STRL LF DISP TIS (BLADE) IMPLANT
BLADE SURG 15 STRL SS (BLADE)
CANISTER SUCT 3000ML PPV (MISCELLANEOUS) ×3 IMPLANT
CATH ROBINSON RED A/P 10FR (CATHETERS) ×3 IMPLANT
CLEANER TIP ELECTROSURG 2X2 (MISCELLANEOUS) ×3 IMPLANT
COAGULATOR SUCT SWTCH 10FR 6 (ELECTROSURGICAL) ×3 IMPLANT
COVER BACK TABLE 60X90IN (DRAPES) ×3 IMPLANT
COVER MAYO STAND STRL (DRAPES) ×3 IMPLANT
DRAPE HALF SHEET 40X57 (DRAPES) ×3 IMPLANT
ELECT COATED BLADE 2.86 ST (ELECTRODE) ×3 IMPLANT
ELECT REM PT RETURN 9FT ADLT (ELECTROSURGICAL)
ELECT REM PT RETURN 9FT PED (ELECTROSURGICAL)
ELECTRODE REM PT RETRN 9FT PED (ELECTROSURGICAL) IMPLANT
ELECTRODE REM PT RTRN 9FT ADLT (ELECTROSURGICAL) IMPLANT
GAUZE 4X4 16PLY ~~LOC~~+RFID DBL (SPONGE) ×3 IMPLANT
GLOVE ECLIPSE 7.5 STRL STRAW (GLOVE) ×3 IMPLANT
GOWN STRL REUS W/ TWL LRG LVL3 (GOWN DISPOSABLE) ×4 IMPLANT
GOWN STRL REUS W/TWL LRG LVL3 (GOWN DISPOSABLE) ×6
GUARD TEETH (MISCELLANEOUS) IMPLANT
KIT BASIN OR (CUSTOM PROCEDURE TRAY) ×3 IMPLANT
KIT TURNOVER KIT B (KITS) ×3 IMPLANT
NS IRRIG 1000ML POUR BTL (IV SOLUTION) ×3 IMPLANT
PACK SURGICAL SETUP 50X90 (CUSTOM PROCEDURE TRAY) ×3 IMPLANT
PAD ARMBOARD 7.5X6 YLW CONV (MISCELLANEOUS) ×6 IMPLANT
PATTIES SURGICAL .5 X3 (DISPOSABLE) IMPLANT
PENCIL FOOT CONTROL (ELECTRODE) ×3 IMPLANT
SOL ANTI FOG 6CC (MISCELLANEOUS) IMPLANT
SOLUTION ANTI FOG 6CC (MISCELLANEOUS)
SPECIMEN JAR SMALL (MISCELLANEOUS) ×6 IMPLANT
SPONGE TONSIL TAPE 1 RFD (DISPOSABLE) ×3 IMPLANT
SYR BULB EAR ULCER 3OZ GRN STR (SYRINGE) ×3 IMPLANT
TOWEL GREEN STERILE (TOWEL DISPOSABLE) ×3 IMPLANT
TOWEL GREEN STERILE FF (TOWEL DISPOSABLE) ×6 IMPLANT
TUBE CONNECTING 12X1/4 (SUCTIONS) ×3 IMPLANT
TUBE SALEM SUMP 10F W/ARV (TUBING) IMPLANT
TUBE SALEM SUMP 12R W/ARV (TUBING) IMPLANT
TUBE SALEM SUMP 14F W/ARV (TUBING) IMPLANT
TUBE SALEM SUMP 16 FR W/ARV (TUBING) IMPLANT
WATER STERILE IRR 1000ML POUR (IV SOLUTION) ×3 IMPLANT

## 2021-07-18 NOTE — Anesthesia Postprocedure Evaluation (Signed)
Anesthesia Post Note ? ?Patient: Alexandra Matthews ? ?Procedure(s) Performed: DIRECT LARYNGOSCOPY MULTIPLE BIOPSIES (Mouth) ?TONSILLECTOMY (Bilateral: Throat) ? ?  ? ?Patient location during evaluation: PACU ?Anesthesia Type: General ?Level of consciousness: awake and alert and oriented ?Pain management: pain level controlled ?Vital Signs Assessment: post-procedure vital signs reviewed and stable ?Respiratory status: spontaneous breathing, nonlabored ventilation and respiratory function stable ?Cardiovascular status: blood pressure returned to baseline and stable ?Postop Assessment: no apparent nausea or vomiting ?Anesthetic complications: no ? ? ?No notable events documented. ? ?Last Vitals:  ?Vitals:  ? 07/18/21 1332 07/18/21 1347  ?BP: 111/66 115/63  ?Pulse: 64 64  ?Resp: 11 17  ?Temp:    ?SpO2: 94% 92%  ?  ?Last Pain:  ?Vitals:  ? 07/18/21 1332  ?TempSrc:   ?PainSc: Asleep  ? ? ?  ?  ?  ?  ?  ?  ? ?Dainel Arcidiacono A. ? ? ? ? ?

## 2021-07-18 NOTE — Anesthesia Preprocedure Evaluation (Signed)
Anesthesia Evaluation  ?Patient identified by MRN, date of birth, ID band ?Patient awake ? ? ? ?Reviewed: ?Allergy & Precautions, NPO status , Patient's Chart, lab work & pertinent test results ? ?Airway ?Mallampati: I ? ?TM Distance: >3 FB ?Neck ROM: Full ? ? ? Dental ?no notable dental hx. ?(+) Chipped, Caps, Dental Advisory Given,  ?  ?Pulmonary ? ?  ?Pulmonary exam normal ?breath sounds clear to auscultation ? ? ? ? ? ? Cardiovascular ?negative cardio ROS ?Normal cardiovascular exam ?Rhythm:Regular Rate:Normal ? ? ?  ?Neuro/Psych ?negative neurological ROS ?   ? GI/Hepatic ?negative GI ROS, Neg liver ROS,   ?Endo/Other  ?Hypothyroidism  ? Renal/GU ?negative Renal ROS  ? ?  ?Musculoskeletal ?Right neck mass  ? Abdominal ?  ?Peds ? Hematology ?negative hematology ROS ?(+)   ?Anesthesia Other Findings ? ? Reproductive/Obstetrics ? ?  ? ? ? ? ? ? ? ? ? ? ? ? ? ?  ?  ? ? ? ? ? ? ? ? ?Anesthesia Physical ?Anesthesia Plan ? ?ASA: 2 ? ?Anesthesia Plan: General  ? ?Post-op Pain Management: Minimal or no pain anticipated  ? ?Induction: Intravenous ? ?PONV Risk Score and Plan: 4 or greater and Treatment may vary due to age or medical condition and Ondansetron ? ?Airway Management Planned: Oral ETT ? ?Additional Equipment: None ? ?Intra-op Plan:  ? ?Post-operative Plan: Extubation in OR ? ?Informed Consent: I have reviewed the patients History and Physical, chart, labs and discussed the procedure including the risks, benefits and alternatives for the proposed anesthesia with the patient or authorized representative who has indicated his/her understanding and acceptance.  ? ? ? ?Dental advisory given ? ?Plan Discussed with: CRNA and Anesthesiologist ? ?Anesthesia Plan Comments:   ? ? ? ? ? ? ?Anesthesia Quick Evaluation ? ?

## 2021-07-18 NOTE — Anesthesia Procedure Notes (Signed)
Procedure Name: Intubation ?Date/Time: 07/18/2021 11:40 AM ?Performed by: Carmelina Paddock, RN ?Pre-anesthesia Checklist: Patient identified, Emergency Drugs available, Suction available and Patient being monitored ?Patient Re-evaluated:Patient Re-evaluated prior to induction ?Oxygen Delivery Method: Circle system utilized ?Preoxygenation: Pre-oxygenation with 100% oxygen ?Induction Type: IV induction ?Ventilation: Mask ventilation without difficulty ?Laryngoscope Size: 2 and Miller ?Grade View: Grade I ?Tube type: Oral ?Tube size: 7.0 mm ?Number of attempts: 1 ?Airway Equipment and Method: Stylet and Oral airway ?Placement Confirmation: ETT inserted through vocal cords under direct vision, positive ETCO2 and breath sounds checked- equal and bilateral ?Secured at: 21 cm ?Tube secured with: Tape ?Dental Injury: Teeth and Oropharynx as per pre-operative assessment  ? ? ? ? ?

## 2021-07-18 NOTE — Interval H&P Note (Signed)
History and Physical Interval Note: ? ?07/18/2021 ?11:09 AM ? ?Alexandra Matthews  has presented today for surgery, with the diagnosis of Neck mass.  The various methods of treatment have been discussed with the patient and family. After consideration of risks, benefits and other options for treatment, the patient has consented to  Procedure(s): ?DIRECT LARYNGOSCOPY; ESOPHAGOSCOPY; MULTIPLE BIOPSIES (N/A) ?POSSIBLE TONSILLECTOMY (Bilateral) as a surgical intervention.  The patient's history has been reviewed, patient examined, no change in status, stable for surgery.  I have reviewed the patient's chart and labs.  Questions were answered to the patient's satisfaction.   ? ? ?[name] ? ? ?

## 2021-07-18 NOTE — Transfer of Care (Signed)
Immediate Anesthesia Transfer of Care Note ? ?Patient: Alexandra Matthews ? ?Procedure(s) Performed: DIRECT LARYNGOSCOPY MULTIPLE BIOPSIES (Mouth) ?TONSILLECTOMY (Bilateral: Throat) ? ?Patient Location: PACU ? ?Anesthesia Type:General ? ?Level of Consciousness: awake, alert  and oriented ? ?Airway & Oxygen Therapy: Patient Spontanous Breathing and Patient connected to face mask oxygen ? ?Post-op Assessment: Report given to RN and Post -op Vital signs reviewed and stable ? ?Post vital signs: Reviewed and stable ? ?Last Vitals:  ?Vitals Value Taken Time  ?BP 127/69 07/18/21 1302  ?Temp    ?Pulse 68 07/18/21 1303  ?Resp 16 07/18/21 1303  ?SpO2 99 % 07/18/21 1303  ?Vitals shown include unvalidated device data. ? ?Last Pain:  ?Vitals:  ? 07/18/21 1040  ?TempSrc:   ?PainSc: 0-No pain  ?   ? ?  ? ?Complications: No notable events documented. ?

## 2021-07-18 NOTE — Op Note (Signed)
OPERATIVE REPORT ? ?DATE OF SURGERY: 07/18/2021 ? ?PATIENT:  Alexandra Matthews,  71 y.o. female ? ?PRE-OPERATIVE DIAGNOSIS: Metastatic cancer right neck ? ?POST-OPERATIVE DIAGNOSIS: Same ? ?PROCEDURE:  Procedure(s): ?DIRECT LARYNGOSCOPY MULTIPLE BIOPSIES ?TONSILLECTOMY ? ?SURGEON:  Beckie Salts, MD ? ?ASSISTANTS: None ? ?ANESTHESIA:   General  ? ?EBL: 100 ml ? ?DRAINS: None ? ?LOCAL MEDICATIONS USED:  None ? ?SPECIMEN: 1.  Right tongue base, frozen section negative for carcinoma. ?2.  Right tonsil biopsy, frozen section, negative for carcinoma. ?3.  Right vallecula. ?4.  Right piriform sinus. ?5.  Right nasopharynx. ?6.  Right tonsil. ?7.  Left tonsil. ? ?COUNTS:  Correct ? ?PROCEDURE DETAILS: ?The patient was taken to the operating room and placed on the operating table in the supine position. Following induction of general endotracheal anesthesia, tube was turned 90 degrees.  A maxillary tooth protector was used throughout the endoscopy portion of the case.  A Jako laryngoscope was used to evaluate the larynx hypopharynx and oropharyngeal structures.  Manual palpation of the oropharynx was also performed.  There is slight suggestion of firmness in the right tonsil and tongue base.  Using the laryngoscopy for visualization biopsies were taken from both places sent for frozen section analysis.  They were both negative.  At this point the additional biopsies were taken from the vallecula piriform sinus and nasopharynx. ? ?2.  Tonsillectomy.  The mouthgag was then inserted and attached to the [place] stand retract the tongue and mandible.  A red rubber catheter was inserted in the right side of the nose used to retract the soft palate and uvula.  Tonsillectomy was performed with electrocautery dissection.  Suction cautery was used for completion of hemostasis.  The right tonsil was taken first and sent separately labeled right tonsil for routine pathology.  The left side was sent separately as well.  The pharynx was  suctioned blood and secretions and irrigated with saline.  Orogastric tube was used to aspirate the contents of the stomach.  Patient was awakened extubated and transferred to recovery in stable condition. ? ? ? ?PATIENT DISPOSITION:  To PACU, stable ? ? ? ?

## 2021-07-19 ENCOUNTER — Encounter (HOSPITAL_COMMUNITY): Payer: Self-pay | Admitting: Otolaryngology

## 2021-07-20 LAB — SURGICAL PATHOLOGY

## 2021-08-22 DIAGNOSIS — R221 Localized swelling, mass and lump, neck: Secondary | ICD-10-CM | POA: Diagnosis not present

## 2021-08-22 DIAGNOSIS — C770 Secondary and unspecified malignant neoplasm of lymph nodes of head, face and neck: Secondary | ICD-10-CM | POA: Diagnosis not present

## 2021-08-22 DIAGNOSIS — C099 Malignant neoplasm of tonsil, unspecified: Secondary | ICD-10-CM | POA: Diagnosis not present

## 2021-09-10 DIAGNOSIS — C099 Malignant neoplasm of tonsil, unspecified: Secondary | ICD-10-CM | POA: Diagnosis not present

## 2021-09-20 DIAGNOSIS — C099 Malignant neoplasm of tonsil, unspecified: Secondary | ICD-10-CM | POA: Diagnosis not present

## 2021-09-20 DIAGNOSIS — C109 Malignant neoplasm of oropharynx, unspecified: Secondary | ICD-10-CM | POA: Diagnosis not present

## 2021-09-20 DIAGNOSIS — C098 Malignant neoplasm of overlapping sites of tonsil: Secondary | ICD-10-CM | POA: Diagnosis not present

## 2021-09-20 DIAGNOSIS — R001 Bradycardia, unspecified: Secondary | ICD-10-CM | POA: Diagnosis not present

## 2021-09-20 DIAGNOSIS — E039 Hypothyroidism, unspecified: Secondary | ICD-10-CM | POA: Diagnosis not present

## 2021-09-20 DIAGNOSIS — C770 Secondary and unspecified malignant neoplasm of lymph nodes of head, face and neck: Secondary | ICD-10-CM | POA: Diagnosis not present

## 2021-09-20 DIAGNOSIS — J358 Other chronic diseases of tonsils and adenoids: Secondary | ICD-10-CM | POA: Diagnosis not present

## 2021-10-09 DIAGNOSIS — E7800 Pure hypercholesterolemia, unspecified: Secondary | ICD-10-CM | POA: Diagnosis not present

## 2021-10-09 DIAGNOSIS — E039 Hypothyroidism, unspecified: Secondary | ICD-10-CM | POA: Diagnosis not present

## 2021-10-09 DIAGNOSIS — Z1211 Encounter for screening for malignant neoplasm of colon: Secondary | ICD-10-CM | POA: Diagnosis not present

## 2021-10-09 DIAGNOSIS — C099 Malignant neoplasm of tonsil, unspecified: Secondary | ICD-10-CM | POA: Diagnosis not present

## 2021-10-09 DIAGNOSIS — Z0000 Encounter for general adult medical examination without abnormal findings: Secondary | ICD-10-CM | POA: Diagnosis not present

## 2021-10-31 DIAGNOSIS — C099 Malignant neoplasm of tonsil, unspecified: Secondary | ICD-10-CM | POA: Diagnosis not present

## 2021-11-07 DIAGNOSIS — R918 Other nonspecific abnormal finding of lung field: Secondary | ICD-10-CM | POA: Diagnosis not present

## 2021-11-07 DIAGNOSIS — E039 Hypothyroidism, unspecified: Secondary | ICD-10-CM | POA: Diagnosis not present

## 2021-11-07 DIAGNOSIS — C770 Secondary and unspecified malignant neoplasm of lymph nodes of head, face and neck: Secondary | ICD-10-CM | POA: Diagnosis not present

## 2021-11-07 DIAGNOSIS — C099 Malignant neoplasm of tonsil, unspecified: Secondary | ICD-10-CM | POA: Diagnosis not present

## 2021-11-07 DIAGNOSIS — Z510 Encounter for antineoplastic radiation therapy: Secondary | ICD-10-CM | POA: Diagnosis not present

## 2021-11-07 DIAGNOSIS — C109 Malignant neoplasm of oropharynx, unspecified: Secondary | ICD-10-CM | POA: Diagnosis not present

## 2021-11-08 DIAGNOSIS — C099 Malignant neoplasm of tonsil, unspecified: Secondary | ICD-10-CM | POA: Diagnosis not present

## 2021-11-08 DIAGNOSIS — Z510 Encounter for antineoplastic radiation therapy: Secondary | ICD-10-CM | POA: Diagnosis not present

## 2021-11-08 DIAGNOSIS — C770 Secondary and unspecified malignant neoplasm of lymph nodes of head, face and neck: Secondary | ICD-10-CM | POA: Diagnosis not present

## 2021-11-12 DIAGNOSIS — R918 Other nonspecific abnormal finding of lung field: Secondary | ICD-10-CM | POA: Diagnosis not present

## 2021-11-13 DIAGNOSIS — C099 Malignant neoplasm of tonsil, unspecified: Secondary | ICD-10-CM | POA: Diagnosis not present

## 2021-11-13 DIAGNOSIS — C770 Secondary and unspecified malignant neoplasm of lymph nodes of head, face and neck: Secondary | ICD-10-CM | POA: Diagnosis not present

## 2021-11-13 DIAGNOSIS — R918 Other nonspecific abnormal finding of lung field: Secondary | ICD-10-CM | POA: Diagnosis not present

## 2021-11-13 DIAGNOSIS — Z510 Encounter for antineoplastic radiation therapy: Secondary | ICD-10-CM | POA: Diagnosis not present

## 2021-11-13 DIAGNOSIS — Z5111 Encounter for antineoplastic chemotherapy: Secondary | ICD-10-CM | POA: Diagnosis not present

## 2021-11-14 DIAGNOSIS — C099 Malignant neoplasm of tonsil, unspecified: Secondary | ICD-10-CM | POA: Diagnosis not present

## 2021-11-14 DIAGNOSIS — Z510 Encounter for antineoplastic radiation therapy: Secondary | ICD-10-CM | POA: Diagnosis not present

## 2021-11-14 DIAGNOSIS — C770 Secondary and unspecified malignant neoplasm of lymph nodes of head, face and neck: Secondary | ICD-10-CM | POA: Diagnosis not present

## 2021-11-15 DIAGNOSIS — C770 Secondary and unspecified malignant neoplasm of lymph nodes of head, face and neck: Secondary | ICD-10-CM | POA: Diagnosis not present

## 2021-11-15 DIAGNOSIS — Z510 Encounter for antineoplastic radiation therapy: Secondary | ICD-10-CM | POA: Diagnosis not present

## 2021-11-15 DIAGNOSIS — C099 Malignant neoplasm of tonsil, unspecified: Secondary | ICD-10-CM | POA: Diagnosis not present

## 2021-11-16 DIAGNOSIS — Z510 Encounter for antineoplastic radiation therapy: Secondary | ICD-10-CM | POA: Diagnosis not present

## 2021-11-16 DIAGNOSIS — C099 Malignant neoplasm of tonsil, unspecified: Secondary | ICD-10-CM | POA: Diagnosis not present

## 2021-11-16 DIAGNOSIS — C770 Secondary and unspecified malignant neoplasm of lymph nodes of head, face and neck: Secondary | ICD-10-CM | POA: Diagnosis not present

## 2021-11-19 DIAGNOSIS — Z510 Encounter for antineoplastic radiation therapy: Secondary | ICD-10-CM | POA: Diagnosis not present

## 2021-11-19 DIAGNOSIS — C099 Malignant neoplasm of tonsil, unspecified: Secondary | ICD-10-CM | POA: Diagnosis not present

## 2021-11-19 DIAGNOSIS — C770 Secondary and unspecified malignant neoplasm of lymph nodes of head, face and neck: Secondary | ICD-10-CM | POA: Diagnosis not present

## 2021-11-20 DIAGNOSIS — C770 Secondary and unspecified malignant neoplasm of lymph nodes of head, face and neck: Secondary | ICD-10-CM | POA: Diagnosis not present

## 2021-11-20 DIAGNOSIS — Z510 Encounter for antineoplastic radiation therapy: Secondary | ICD-10-CM | POA: Diagnosis not present

## 2021-11-20 DIAGNOSIS — C099 Malignant neoplasm of tonsil, unspecified: Secondary | ICD-10-CM | POA: Diagnosis not present

## 2021-11-20 DIAGNOSIS — Z5111 Encounter for antineoplastic chemotherapy: Secondary | ICD-10-CM | POA: Diagnosis not present

## 2021-11-21 DIAGNOSIS — C770 Secondary and unspecified malignant neoplasm of lymph nodes of head, face and neck: Secondary | ICD-10-CM | POA: Diagnosis not present

## 2021-11-21 DIAGNOSIS — Z510 Encounter for antineoplastic radiation therapy: Secondary | ICD-10-CM | POA: Diagnosis not present

## 2021-11-21 DIAGNOSIS — C099 Malignant neoplasm of tonsil, unspecified: Secondary | ICD-10-CM | POA: Diagnosis not present

## 2021-11-22 DIAGNOSIS — C770 Secondary and unspecified malignant neoplasm of lymph nodes of head, face and neck: Secondary | ICD-10-CM | POA: Diagnosis not present

## 2021-11-22 DIAGNOSIS — C099 Malignant neoplasm of tonsil, unspecified: Secondary | ICD-10-CM | POA: Diagnosis not present

## 2021-11-22 DIAGNOSIS — Z510 Encounter for antineoplastic radiation therapy: Secondary | ICD-10-CM | POA: Diagnosis not present

## 2021-11-23 DIAGNOSIS — Z510 Encounter for antineoplastic radiation therapy: Secondary | ICD-10-CM | POA: Diagnosis not present

## 2021-11-23 DIAGNOSIS — C099 Malignant neoplasm of tonsil, unspecified: Secondary | ICD-10-CM | POA: Diagnosis not present

## 2021-11-23 DIAGNOSIS — C770 Secondary and unspecified malignant neoplasm of lymph nodes of head, face and neck: Secondary | ICD-10-CM | POA: Diagnosis not present

## 2021-11-26 DIAGNOSIS — C770 Secondary and unspecified malignant neoplasm of lymph nodes of head, face and neck: Secondary | ICD-10-CM | POA: Diagnosis not present

## 2021-11-26 DIAGNOSIS — Z510 Encounter for antineoplastic radiation therapy: Secondary | ICD-10-CM | POA: Diagnosis not present

## 2021-11-26 DIAGNOSIS — C099 Malignant neoplasm of tonsil, unspecified: Secondary | ICD-10-CM | POA: Diagnosis not present

## 2021-12-12 DIAGNOSIS — D72819 Decreased white blood cell count, unspecified: Secondary | ICD-10-CM | POA: Diagnosis not present

## 2021-12-12 DIAGNOSIS — R490 Dysphonia: Secondary | ICD-10-CM | POA: Diagnosis not present

## 2021-12-12 DIAGNOSIS — C099 Malignant neoplasm of tonsil, unspecified: Secondary | ICD-10-CM | POA: Diagnosis not present

## 2021-12-12 DIAGNOSIS — Z79899 Other long term (current) drug therapy: Secondary | ICD-10-CM | POA: Diagnosis not present

## 2021-12-12 DIAGNOSIS — Z9089 Acquired absence of other organs: Secondary | ICD-10-CM | POA: Diagnosis not present

## 2021-12-12 DIAGNOSIS — Z9221 Personal history of antineoplastic chemotherapy: Secondary | ICD-10-CM | POA: Diagnosis not present

## 2021-12-12 DIAGNOSIS — E039 Hypothyroidism, unspecified: Secondary | ICD-10-CM | POA: Diagnosis not present

## 2021-12-12 DIAGNOSIS — R590 Localized enlarged lymph nodes: Secondary | ICD-10-CM | POA: Diagnosis not present

## 2021-12-26 DIAGNOSIS — Z8619 Personal history of other infectious and parasitic diseases: Secondary | ICD-10-CM | POA: Diagnosis not present

## 2021-12-26 DIAGNOSIS — Z85818 Personal history of malignant neoplasm of other sites of lip, oral cavity, and pharynx: Secondary | ICD-10-CM | POA: Diagnosis not present

## 2021-12-26 DIAGNOSIS — K117 Disturbances of salivary secretion: Secondary | ICD-10-CM | POA: Diagnosis not present

## 2021-12-26 DIAGNOSIS — Q388 Other congenital malformations of pharynx: Secondary | ICD-10-CM | POA: Diagnosis not present

## 2021-12-26 DIAGNOSIS — Z923 Personal history of irradiation: Secondary | ICD-10-CM | POA: Diagnosis not present

## 2021-12-26 DIAGNOSIS — Z79890 Hormone replacement therapy (postmenopausal): Secondary | ICD-10-CM | POA: Diagnosis not present

## 2022-01-09 DIAGNOSIS — D225 Melanocytic nevi of trunk: Secondary | ICD-10-CM | POA: Diagnosis not present

## 2022-01-09 DIAGNOSIS — L820 Inflamed seborrheic keratosis: Secondary | ICD-10-CM | POA: Diagnosis not present

## 2022-01-09 DIAGNOSIS — Z85828 Personal history of other malignant neoplasm of skin: Secondary | ICD-10-CM | POA: Diagnosis not present

## 2022-01-09 DIAGNOSIS — L570 Actinic keratosis: Secondary | ICD-10-CM | POA: Diagnosis not present

## 2022-01-09 DIAGNOSIS — L821 Other seborrheic keratosis: Secondary | ICD-10-CM | POA: Diagnosis not present

## 2022-01-09 DIAGNOSIS — D692 Other nonthrombocytopenic purpura: Secondary | ICD-10-CM | POA: Diagnosis not present

## 2022-01-09 DIAGNOSIS — L814 Other melanin hyperpigmentation: Secondary | ICD-10-CM | POA: Diagnosis not present

## 2022-02-04 DIAGNOSIS — Z08 Encounter for follow-up examination after completed treatment for malignant neoplasm: Secondary | ICD-10-CM | POA: Diagnosis not present

## 2022-02-04 DIAGNOSIS — Z85818 Personal history of malignant neoplasm of other sites of lip, oral cavity, and pharynx: Secondary | ICD-10-CM | POA: Diagnosis not present

## 2022-02-04 DIAGNOSIS — C099 Malignant neoplasm of tonsil, unspecified: Secondary | ICD-10-CM | POA: Diagnosis not present

## 2022-02-04 DIAGNOSIS — Z923 Personal history of irradiation: Secondary | ICD-10-CM | POA: Diagnosis not present

## 2022-05-08 DIAGNOSIS — H524 Presbyopia: Secondary | ICD-10-CM | POA: Diagnosis not present

## 2022-05-08 DIAGNOSIS — H40013 Open angle with borderline findings, low risk, bilateral: Secondary | ICD-10-CM | POA: Diagnosis not present

## 2022-05-08 DIAGNOSIS — H2513 Age-related nuclear cataract, bilateral: Secondary | ICD-10-CM | POA: Diagnosis not present

## 2022-05-08 DIAGNOSIS — H5203 Hypermetropia, bilateral: Secondary | ICD-10-CM | POA: Diagnosis not present

## 2022-05-08 DIAGNOSIS — H353131 Nonexudative age-related macular degeneration, bilateral, early dry stage: Secondary | ICD-10-CM | POA: Diagnosis not present

## 2022-05-14 DIAGNOSIS — C099 Malignant neoplasm of tonsil, unspecified: Secondary | ICD-10-CM | POA: Diagnosis not present

## 2022-05-20 DIAGNOSIS — C099 Malignant neoplasm of tonsil, unspecified: Secondary | ICD-10-CM | POA: Diagnosis not present

## 2022-05-21 DIAGNOSIS — E039 Hypothyroidism, unspecified: Secondary | ICD-10-CM | POA: Diagnosis not present

## 2022-05-21 DIAGNOSIS — Z8589 Personal history of malignant neoplasm of other organs and systems: Secondary | ICD-10-CM | POA: Diagnosis not present

## 2022-05-21 DIAGNOSIS — C099 Malignant neoplasm of tonsil, unspecified: Secondary | ICD-10-CM | POA: Diagnosis not present

## 2022-05-21 DIAGNOSIS — C329 Malignant neoplasm of larynx, unspecified: Secondary | ICD-10-CM | POA: Diagnosis not present

## 2022-05-21 DIAGNOSIS — Z08 Encounter for follow-up examination after completed treatment for malignant neoplasm: Secondary | ICD-10-CM | POA: Diagnosis not present

## 2022-05-30 DIAGNOSIS — A630 Anogenital (venereal) warts: Secondary | ICD-10-CM | POA: Diagnosis not present

## 2022-05-30 DIAGNOSIS — Z79899 Other long term (current) drug therapy: Secondary | ICD-10-CM | POA: Diagnosis not present

## 2022-05-30 DIAGNOSIS — Z08 Encounter for follow-up examination after completed treatment for malignant neoplasm: Secondary | ICD-10-CM | POA: Diagnosis not present

## 2022-05-30 DIAGNOSIS — R5383 Other fatigue: Secondary | ICD-10-CM | POA: Diagnosis not present

## 2022-05-30 DIAGNOSIS — Z9089 Acquired absence of other organs: Secondary | ICD-10-CM | POA: Diagnosis not present

## 2022-05-30 DIAGNOSIS — R682 Dry mouth, unspecified: Secondary | ICD-10-CM | POA: Diagnosis not present

## 2022-05-30 DIAGNOSIS — R439 Unspecified disturbances of smell and taste: Secondary | ICD-10-CM | POA: Diagnosis not present

## 2022-05-30 DIAGNOSIS — Z85818 Personal history of malignant neoplasm of other sites of lip, oral cavity, and pharynx: Secondary | ICD-10-CM | POA: Diagnosis not present

## 2022-05-30 DIAGNOSIS — Z923 Personal history of irradiation: Secondary | ICD-10-CM | POA: Diagnosis not present

## 2022-06-19 DIAGNOSIS — R946 Abnormal results of thyroid function studies: Secondary | ICD-10-CM | POA: Diagnosis not present

## 2022-08-26 DIAGNOSIS — C099 Malignant neoplasm of tonsil, unspecified: Secondary | ICD-10-CM | POA: Diagnosis not present

## 2022-10-31 DIAGNOSIS — Z0000 Encounter for general adult medical examination without abnormal findings: Secondary | ICD-10-CM | POA: Diagnosis not present

## 2022-10-31 DIAGNOSIS — Z5181 Encounter for therapeutic drug level monitoring: Secondary | ICD-10-CM | POA: Diagnosis not present

## 2022-10-31 DIAGNOSIS — Z23 Encounter for immunization: Secondary | ICD-10-CM | POA: Diagnosis not present

## 2022-10-31 DIAGNOSIS — E039 Hypothyroidism, unspecified: Secondary | ICD-10-CM | POA: Diagnosis not present

## 2022-10-31 DIAGNOSIS — Z1211 Encounter for screening for malignant neoplasm of colon: Secondary | ICD-10-CM | POA: Diagnosis not present

## 2022-10-31 DIAGNOSIS — E7800 Pure hypercholesterolemia, unspecified: Secondary | ICD-10-CM | POA: Diagnosis not present

## 2022-11-08 DIAGNOSIS — H353131 Nonexudative age-related macular degeneration, bilateral, early dry stage: Secondary | ICD-10-CM | POA: Diagnosis not present

## 2022-11-19 DIAGNOSIS — Z08 Encounter for follow-up examination after completed treatment for malignant neoplasm: Secondary | ICD-10-CM | POA: Diagnosis not present

## 2022-11-19 DIAGNOSIS — R1310 Dysphagia, unspecified: Secondary | ICD-10-CM | POA: Diagnosis not present

## 2022-11-19 DIAGNOSIS — C099 Malignant neoplasm of tonsil, unspecified: Secondary | ICD-10-CM | POA: Diagnosis not present

## 2022-11-19 DIAGNOSIS — C329 Malignant neoplasm of larynx, unspecified: Secondary | ICD-10-CM | POA: Diagnosis not present

## 2022-11-19 DIAGNOSIS — Z85818 Personal history of malignant neoplasm of other sites of lip, oral cavity, and pharynx: Secondary | ICD-10-CM | POA: Diagnosis not present

## 2022-11-19 DIAGNOSIS — Z85850 Personal history of malignant neoplasm of thyroid: Secondary | ICD-10-CM | POA: Diagnosis not present

## 2022-11-19 DIAGNOSIS — R439 Unspecified disturbances of smell and taste: Secondary | ICD-10-CM | POA: Diagnosis not present

## 2022-11-19 DIAGNOSIS — Z923 Personal history of irradiation: Secondary | ICD-10-CM | POA: Diagnosis not present

## 2022-11-19 DIAGNOSIS — R682 Dry mouth, unspecified: Secondary | ICD-10-CM | POA: Diagnosis not present

## 2022-11-19 DIAGNOSIS — E039 Hypothyroidism, unspecified: Secondary | ICD-10-CM | POA: Diagnosis not present

## 2022-11-19 DIAGNOSIS — Z8589 Personal history of malignant neoplasm of other organs and systems: Secondary | ICD-10-CM | POA: Diagnosis not present

## 2022-11-19 DIAGNOSIS — Z79890 Hormone replacement therapy (postmenopausal): Secondary | ICD-10-CM | POA: Diagnosis not present

## 2023-01-16 DIAGNOSIS — L570 Actinic keratosis: Secondary | ICD-10-CM | POA: Diagnosis not present

## 2023-01-16 DIAGNOSIS — L814 Other melanin hyperpigmentation: Secondary | ICD-10-CM | POA: Diagnosis not present

## 2023-01-16 DIAGNOSIS — D0471 Carcinoma in situ of skin of right lower limb, including hip: Secondary | ICD-10-CM | POA: Diagnosis not present

## 2023-01-16 DIAGNOSIS — L718 Other rosacea: Secondary | ICD-10-CM | POA: Diagnosis not present

## 2023-01-16 DIAGNOSIS — C44212 Basal cell carcinoma of skin of right ear and external auricular canal: Secondary | ICD-10-CM | POA: Diagnosis not present

## 2023-01-16 DIAGNOSIS — Z85828 Personal history of other malignant neoplasm of skin: Secondary | ICD-10-CM | POA: Diagnosis not present

## 2023-02-10 DIAGNOSIS — R221 Localized swelling, mass and lump, neck: Secondary | ICD-10-CM | POA: Diagnosis not present

## 2023-02-10 DIAGNOSIS — C099 Malignant neoplasm of tonsil, unspecified: Secondary | ICD-10-CM | POA: Diagnosis not present

## 2023-03-11 DIAGNOSIS — Z85828 Personal history of other malignant neoplasm of skin: Secondary | ICD-10-CM | POA: Diagnosis not present

## 2023-03-11 DIAGNOSIS — C44212 Basal cell carcinoma of skin of right ear and external auricular canal: Secondary | ICD-10-CM | POA: Diagnosis not present

## 2023-03-26 DIAGNOSIS — M545 Low back pain, unspecified: Secondary | ICD-10-CM | POA: Diagnosis not present

## 2023-03-27 DIAGNOSIS — M545 Low back pain, unspecified: Secondary | ICD-10-CM | POA: Diagnosis not present

## 2023-05-02 DIAGNOSIS — M4316 Spondylolisthesis, lumbar region: Secondary | ICD-10-CM | POA: Diagnosis not present

## 2023-05-02 DIAGNOSIS — M47816 Spondylosis without myelopathy or radiculopathy, lumbar region: Secondary | ICD-10-CM | POA: Diagnosis not present

## 2023-05-12 DIAGNOSIS — Z85818 Personal history of malignant neoplasm of other sites of lip, oral cavity, and pharynx: Secondary | ICD-10-CM | POA: Diagnosis not present

## 2023-05-12 DIAGNOSIS — C098 Malignant neoplasm of overlapping sites of tonsil: Secondary | ICD-10-CM | POA: Diagnosis not present

## 2023-05-12 DIAGNOSIS — Z923 Personal history of irradiation: Secondary | ICD-10-CM | POA: Diagnosis not present

## 2023-05-12 DIAGNOSIS — E039 Hypothyroidism, unspecified: Secondary | ICD-10-CM | POA: Diagnosis not present

## 2023-05-12 DIAGNOSIS — B977 Papillomavirus as the cause of diseases classified elsewhere: Secondary | ICD-10-CM | POA: Diagnosis not present

## 2023-05-27 DIAGNOSIS — H524 Presbyopia: Secondary | ICD-10-CM | POA: Diagnosis not present

## 2023-05-27 DIAGNOSIS — H5203 Hypermetropia, bilateral: Secondary | ICD-10-CM | POA: Diagnosis not present

## 2023-05-27 DIAGNOSIS — H353131 Nonexudative age-related macular degeneration, bilateral, early dry stage: Secondary | ICD-10-CM | POA: Diagnosis not present

## 2023-07-17 DIAGNOSIS — L821 Other seborrheic keratosis: Secondary | ICD-10-CM | POA: Diagnosis not present

## 2023-07-17 DIAGNOSIS — D692 Other nonthrombocytopenic purpura: Secondary | ICD-10-CM | POA: Diagnosis not present

## 2023-07-17 DIAGNOSIS — L814 Other melanin hyperpigmentation: Secondary | ICD-10-CM | POA: Diagnosis not present

## 2023-07-17 DIAGNOSIS — D2261 Melanocytic nevi of right upper limb, including shoulder: Secondary | ICD-10-CM | POA: Diagnosis not present

## 2023-07-17 DIAGNOSIS — D225 Melanocytic nevi of trunk: Secondary | ICD-10-CM | POA: Diagnosis not present

## 2023-07-17 DIAGNOSIS — L570 Actinic keratosis: Secondary | ICD-10-CM | POA: Diagnosis not present

## 2023-07-17 DIAGNOSIS — Z85828 Personal history of other malignant neoplasm of skin: Secondary | ICD-10-CM | POA: Diagnosis not present

## 2023-07-29 DIAGNOSIS — M4316 Spondylolisthesis, lumbar region: Secondary | ICD-10-CM | POA: Diagnosis not present

## 2023-08-14 DIAGNOSIS — M545 Low back pain, unspecified: Secondary | ICD-10-CM | POA: Diagnosis not present

## 2023-08-26 DIAGNOSIS — M4807 Spinal stenosis, lumbosacral region: Secondary | ICD-10-CM | POA: Diagnosis not present

## 2023-08-26 DIAGNOSIS — M4316 Spondylolisthesis, lumbar region: Secondary | ICD-10-CM | POA: Diagnosis not present

## 2023-08-26 DIAGNOSIS — M48061 Spinal stenosis, lumbar region without neurogenic claudication: Secondary | ICD-10-CM | POA: Diagnosis not present

## 2023-08-26 DIAGNOSIS — M51369 Other intervertebral disc degeneration, lumbar region without mention of lumbar back pain or lower extremity pain: Secondary | ICD-10-CM | POA: Diagnosis not present

## 2023-08-27 DIAGNOSIS — C099 Malignant neoplasm of tonsil, unspecified: Secondary | ICD-10-CM | POA: Diagnosis not present

## 2023-09-12 DIAGNOSIS — M48061 Spinal stenosis, lumbar region without neurogenic claudication: Secondary | ICD-10-CM | POA: Diagnosis not present

## 2023-10-01 ENCOUNTER — Other Ambulatory Visit: Payer: Self-pay

## 2023-10-01 ENCOUNTER — Ambulatory Visit: Attending: Neurosurgery

## 2023-10-01 DIAGNOSIS — M25552 Pain in left hip: Secondary | ICD-10-CM | POA: Insufficient documentation

## 2023-10-01 DIAGNOSIS — R293 Abnormal posture: Secondary | ICD-10-CM | POA: Diagnosis not present

## 2023-10-01 DIAGNOSIS — M5416 Radiculopathy, lumbar region: Secondary | ICD-10-CM | POA: Insufficient documentation

## 2023-10-01 NOTE — Therapy (Signed)
 OUTPATIENT PHYSICAL THERAPY THORACOLUMBAR EVALUATION   Patient Name: Alexandra Matthews MRN: [id number] DOB:19-Dec-1950, 73 y.o., female Today's Date: 10/01/2023  END OF SESSION:  PT End of Session - 10/01/23 1401     Visit Number 1    Date for PT Re-Evaluation 11/26/23    Progress Note Due on Visit 10    PT Start Time 0800    PT Stop Time 0843    PT Time Calculation (min) 43 min    Activity Tolerance Patient tolerated treatment well    Behavior During Therapy [facility] for tasks assessed/performed          History reviewed. No pertinent past medical history. Past Surgical History:  Procedure Laterality Date   BACK SURGERY     LARYNGOSCOPY AND ESOPHAGOSCOPY N/A 07/18/2021   Procedure: DIRECT LARYNGOSCOPY MULTIPLE BIOPSIES;  Surgeon: Jesus Oliphant, MD;  Location: [facility] OR;  Service: ENT;  Laterality: N/A;   TONSILLECTOMY Bilateral 07/18/2021   Procedure: TONSILLECTOMY;  Surgeon: Jesus Oliphant, MD;  Location: [facility] OR;  Service: ENT;  Laterality: Bilateral;   Patient Active Problem List   Diagnosis Date Noted   S/P tonsillectomy 07/18/2021    PCP: Rexanne Ingle, MD  REFERRING PROVIDER: Mavis Purchase, MD  REFERRING DIAG: lumbar stenosis  Rationale for Evaluation and Treatment: Rehabilitation  THERAPY DIAG:  Radiculopathy, lumbar region  Pain in left hip  Abnormal posture  ONSET DATE: 6 months  SUBJECTIVE:                                                                                                                                                                                           SUBJECTIVE STATEMENT: My biggest problem is at night, I have a lot of sleep interference, also worse with sitting.  I can be active , dug a 25' trench 2 days ago.  Have some numbness L leg.   PERTINENT HISTORY:  Progressive pain L post hip and post L thigh . Had fusion L 4/5 around 16 years ago. Referred to PT by neurosurgeon  PAIN:  Are you having pain? Yes: NPRS scale: 2 to 8/10 Pain  location: L posterior gluteals, lower hip, L lumbar region Pain description: tingling L post hip with sitting prolonged,pain at night L post hip, lower back, weakness climbing up stairs  Aggravating factors: sitting, stair climbing, sleep Relieving factors: walking, moving, pain meds  PRECAUTIONS: None  RED FLAGS: None   WEIGHT BEARING RESTRICTIONS: No  FALLS:  Has patient fallen in last 6 months? No  LIVING ENVIRONMENT: Lives with: lives alone Lives in: House/apartment Stairs: Yes: Internal: one flight  steps; on right going up Has following  equipment at home: Single point cane  OCCUPATION: retired   PLOF: Independent  PATIENT GOALS: Be able to sleep, be able to avoid surgery  NEXT MD VISIT: in a few weeks sees another neurosurgeon for second opinion in the same practice  OBJECTIVE:  Note: Objective measures were completed at Evaluation unless otherwise noted.  DIAGNOSTIC FINDINGS:  Multifaceted lumbar stenosis   PATIENT SURVEYS:  Modified Oswestry Low Back Pain Disability Questionnaire: 11 / 50 = 22.0 %  COGNITION: Overall cognitive status: Within functional limits for tasks assessed     SENSATION: L post thigh parasthesia at night   MUSCLE LENGTH: Hamstrings: Right wnl deg; Left wnl deg Debby test: Right wnl deg; Left wnl deg  POSTURE: R iliac crest elevated greater than 1 cm on R, flattened lumbar region, loss of lordosis  PALPATION: Tender posterior hip Post jt line proximal femur   LUMBAR ROM: all lumbar ROM wnl , excessive, able to place palms on floor   LOWER EXTREMITY ROM:   pain with L hip ER terminal range , lacking 15 degrees All other LE ROM WNL  LOWER EXTREMITY MMT:  L hip extension 4-/5 with lock bridge,  L hip flexion 4/5 B quads, hamstrings wfl, able to heel and toe walk wnl   LUMBAR SPECIAL TESTS:  Straight leg raise test: Negative, Slump test: Negative, Single leg stance test: Negative, and Thomas test: Negative  FUNCTIONAL TESTS:   Single leg stance 20 sec on each, no sway noted  Stair climbing, unable on L to forward step up to 4, 6 or 8 step without pain/ weakness   GAIT: Distance walked: in clinic, no deficits noted  TREATMENT DATE: 10/01/23: evaluation: one set of mobilization with movement for L hip capsule for L hip ER, lat glides L hip jt, 15 reps Instructed in bridge, with feet on elevated surface, used 6 step today on the table. Advised to perform up to 30 reps daily to isolate L glutes Provided with 1/8 insert L shoe to improve postural alignment.                                                                                                                                 PATIENT EDUCATION:  Education details: POC, goals  Person educated: Patient Education method: Explanation, Demonstration, Tactile cues, and Verbal cues Education comprehension: verbalized understanding, returned demonstration, and verbal cues required  HOME EXERCISE PROGRAM: TBD  ASSESSMENT:  CLINICAL IMPRESSION: Patient is a 73 y.o. 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 Assessment reveals some replication of her pain with L hip ER terminal range and weakness L hip extension, glut max and glut medius.  Tender over posterior aspect of L hip jt .  Also some postural changes with noticeable difference in iliac crest height , L LE lower.  She responded well today to initial education and instruction in strengthening  for gluts.  Should benefit from skilled PT to address her deficits and improve her symptoms.  OBJECTIVE IMPAIRMENTS: decreased activity tolerance, decreased knowledge of condition, decreased strength, impaired perceived functional ability, postural dysfunction, and pain.   ACTIVITY LIMITATIONS: carrying, lifting, sitting, squatting, sleeping, stairs, and transfers  PARTICIPATION LIMITATIONS: cleaning, laundry, community activity, and yard  work  PERSONAL FACTORS: Age, Behavior pattern, Past/current experiences, Time since onset of injury/illness/exacerbation, and 1-2 comorbidities: h/o prior L4/5 fusion, h/o throat cancer are also affecting patient's functional outcome.   REHAB POTENTIAL: Good  CLINICAL DECISION MAKING: Evolving/moderate complexity  EVALUATION COMPLEXITY: Moderate   GOALS: Goals reviewed with patient? Yes  SHORT TERM GOALS: Target date: 2 weeks Oct 15 2023  I HEP Baseline: Goal status: INITIAL   LONG TERM GOALS: Target date: 8 weeks, 11/26/23  Able to climb 8 steps reciprocally without L post  hip pain Baseline: unable to perform reciprocally Goal status: INITIAL  2.  Modified oswestry improve from 22% deficit to 12% Baseline:  Goal status: INITIAL  3.  Strength L hip extension, improve from 4-/5 to 5/5 Baseline:  Goal status: INITIAL   PLAN:  PT FREQUENCY: 1-2x/week  PT DURATION: 8 weeks  PLANNED INTERVENTIONS: 97110-Therapeutic exercises, 97530- Therapeutic activity, [id number]- Neuromuscular re-education, 97535- Self Care, 02859- Manual therapy, 97012- Traction (mechanical), [phone]- Ionotophoresis 4mg /ml Dexamethasone , Patient/Family education, Stair training, and Joint mobilization.  PLAN FOR NEXT SESSION: how has the bridging ex been and how did the lift L shoe work?  Reassess L hip strength for gluts, and progress as appropriate, manual techniques to improve lumbar mobility and L hip pain   Devin Foskey L Duel Conrad, PT, DPT, OCS 10/01/2023, 3:37 PM

## 2023-10-08 ENCOUNTER — Ambulatory Visit: Attending: Internal Medicine

## 2023-10-08 ENCOUNTER — Other Ambulatory Visit: Payer: Self-pay

## 2023-10-08 DIAGNOSIS — R293 Abnormal posture: Secondary | ICD-10-CM | POA: Insufficient documentation

## 2023-10-08 DIAGNOSIS — M25552 Pain in left hip: Secondary | ICD-10-CM | POA: Diagnosis not present

## 2023-10-08 DIAGNOSIS — M5416 Radiculopathy, lumbar region: Secondary | ICD-10-CM | POA: Insufficient documentation

## 2023-10-08 NOTE — Therapy (Signed)
 OUTPATIENT PHYSICAL THERAPY THORACOLUMBAR EVALUATION   Patient Name: Alexandra Matthews MRN: [id number] DOB:10-May-1950, 73 y.o., female Today's Date: 10/08/2023  END OF SESSION:  PT End of Session - 10/08/23 1649     Visit Number 2    Date for PT Re-Evaluation 11/26/23    Progress Note Due on Visit 10    PT Start Time 1558    PT Stop Time 1643    PT Time Calculation (min) 45 min    Activity Tolerance Patient tolerated treatment well    Behavior During Therapy [facility] for tasks assessed/performed           History reviewed. No pertinent past medical history. Past Surgical History:  Procedure Laterality Date   BACK SURGERY     LARYNGOSCOPY AND ESOPHAGOSCOPY N/A 07/18/2021   Procedure: DIRECT LARYNGOSCOPY MULTIPLE BIOPSIES;  Surgeon: Jesus Oliphant, MD;  Location: [facility] OR;  Service: ENT;  Laterality: N/A;   TONSILLECTOMY Bilateral 07/18/2021   Procedure: TONSILLECTOMY;  Surgeon: Jesus Oliphant, MD;  Location: [facility] OR;  Service: ENT;  Laterality: Bilateral;   Patient Active Problem List   Diagnosis Date Noted   S/P tonsillectomy 07/18/2021    PCP: Rexanne Ingle, MD  REFERRING PROVIDER: Mavis Purchase, MD  REFERRING DIAG: lumbar stenosis  Rationale for Evaluation and Treatment: Rehabilitation  THERAPY DIAG:  Radiculopathy, lumbar region  Pain in left hip  Abnormal posture  ONSET DATE: 6 months  SUBJECTIVE:                                                                                                                                                                                           SUBJECTIVE STATEMENT: 10/08/23: L hip felt better for about 2 days, then started hurting again.  Still can't sleep at night, due to lower back pain,  but overall very happy after initial visit.  Noticed some better with stair climbing Eval:My biggest problem is at night, I have a lot of sleep interference, also worse with sitting.  I can be active , dug a 25' trench 2 days ago.  Have some  numbness L leg.   PERTINENT HISTORY:  Progressive pain L post hip and post L thigh . Had fusion L 4/5 around 16 years ago. Referred to PT by neurosurgeon  PAIN:  Are you having pain? Yes: NPRS scale: 2 to 8/10 Pain location: L posterior gluteals, lower hip, L lumbar region Pain description: tingling L post hip with sitting prolonged,pain at night L post hip, lower back, weakness climbing up stairs  Aggravating factors: sitting, stair climbing, sleep Relieving factors: walking, moving, pain meds  PRECAUTIONS: None  RED FLAGS: None  WEIGHT BEARING RESTRICTIONS: No  FALLS:  Has patient fallen in last 6 months? No  LIVING ENVIRONMENT: Lives with: lives alone Lives in: House/apartment Stairs: Yes: Internal: one flight  steps; on right going up Has following equipment at home: Single point cane  OCCUPATION: retired   PLOF: Independent  PATIENT GOALS: Be able to sleep, be able to avoid surgery  NEXT MD VISIT: in a few weeks sees another neurosurgeon for second opinion in the same practice  OBJECTIVE:  Note: Objective measures were completed at Evaluation unless otherwise noted.  DIAGNOSTIC FINDINGS:  Multifaceted lumbar stenosis   PATIENT SURVEYS:  Modified Oswestry Low Back Pain Disability Questionnaire: 11 / 50 = 22.0 %  COGNITION: Overall cognitive status: Within functional limits for tasks assessed     SENSATION: L post thigh parasthesia at night   MUSCLE LENGTH: Hamstrings: Right wnl deg; Left wnl deg Debby test: Right wnl deg; Left wnl deg  POSTURE: R iliac crest elevated greater than 1 cm on R, flattened lumbar region, loss of lordosis  PALPATION: Tender posterior hip Post jt line proximal femur   LUMBAR ROM: all lumbar ROM wnl , excessive, able to place palms on floor   LOWER EXTREMITY ROM:   pain with L hip ER terminal range , lacking 15 degrees All other LE ROM WNL  LOWER EXTREMITY MMT:  L hip extension 4-/5 with lock bridge,  L hip flexion  4/5 B quads, hamstrings wfl, able to heel and toe walk wnl   LUMBAR SPECIAL TESTS:  Straight leg raise test: Negative, Slump test: Negative, Single leg stance test: Negative, and Thomas test: Negative  FUNCTIONAL TESTS:  Single leg stance 20 sec on each, no sway noted  Stair climbing, unable on L to forward step up to 4, 6 or 8 step without pain/ weakness   GAIT: Distance walked: in clinic, no deficits noted  TREATMENT DATE:  10/08/23:   Manual;  Lateral glides of femur while stretching into L hip ER, 2 bouts of 15 reps in supine Side lying R for deep soft tissue cross friction massage L piriformis, gluteus medius, L superior posterior hip jt at tendinous attachments.   Therex: Pt demonstrated B bridging, with feet on 6 block she is performing once or 2 x day  Varied the intensity of bridging, utilized black t band around knees , maintaining isometric hip abd , for bridging with isolation of L hip abductor musculature.  Pt noted good engagement of her lat/ weak hip extensors L Also bridging with adductor squeeze with ease, so did not pursue this ex for home Attempted unilateral bridging but hamstring cramping so did not continue  Added supine hamstring/tfl/glut med stretch with yoga strap, t performed on each side  10/01/23: evaluation: one set of mobilization with movement for L hip capsule for L hip ER, lat glides L hip jt, 15 reps Instructed in bridge, with feet on elevated surface, used 6 step today on the table. Advised to perform up to 30 reps daily to isolate L glutes Provided with 1/8 insert L shoe to improve postural alignment.  PATIENT EDUCATION:  Education details: POC, goals  Person educated: Patient Education method: Explanation, Demonstration, Tactile cues, and Verbal cues Education comprehension: verbalized understanding, returned  demonstration, and verbal cues required  HOME EXERCISE PROGRAM: TBD  ASSESSMENT:  CLINICAL IMPRESSION: 10/08/23:  Pt. improved after initial session ,, today addressed more muscular tenderness, restriction, and we advanced her home program to further isolate her weak post and lat L hip musculature.  She tolerated well. She will return in 2 weeks, we will reassess again. Eval: Patient is a 73 y.o. 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 Assessment reveals some replication of her pain with L hip ER terminal range and weakness L hip extension, glut max and glut medius.  Tender over posterior aspect of L hip jt .  Also some postural changes with noticeable difference in iliac crest height , L LE lower.  She responded well today to initial education and instruction in strengthening for gluts.  Should benefit from skilled PT to address her deficits and improve her symptoms.  OBJECTIVE IMPAIRMENTS: decreased activity tolerance, decreased knowledge of condition, decreased strength, impaired perceived functional ability, postural dysfunction, and pain.   ACTIVITY LIMITATIONS: carrying, lifting, sitting, squatting, sleeping, stairs, and transfers  PARTICIPATION LIMITATIONS: cleaning, laundry, community activity, and yard work  PERSONAL FACTORS: Age, Behavior pattern, Past/current experiences, Time since onset of injury/illness/exacerbation, and 1-2 comorbidities: h/o prior L4/5 fusion, h/o throat cancer are also affecting patient's functional outcome.   REHAB POTENTIAL: Good  CLINICAL DECISION MAKING: Evolving/moderate complexity  EVALUATION COMPLEXITY: Moderate   GOALS: Goals reviewed with patient? Yes  SHORT TERM GOALS: Target date: 2 weeks Oct 15 2023  I HEP Baseline: Goal status: INITIAL   LONG TERM GOALS: Target date: 8 weeks, 11/26/23  Able to climb 8 steps reciprocally without L post  hip pain Baseline:  unable to perform reciprocally Goal status: INITIAL  2.  Modified oswestry improve from 22% deficit to 12% Baseline:  Goal status: INITIAL  3.  Strength L hip extension, improve from 4-/5 to 5/5 Baseline:  Goal status: INITIAL   PLAN:  PT FREQUENCY: 1-2x/week  PT DURATION: 8 weeks  PLANNED INTERVENTIONS: 97110-Therapeutic exercises, 97530- Therapeutic activity, [id number]- Neuromuscular re-education, 97535- Self Care, 02859- Manual therapy, 97012- Traction (mechanical), [id number]- Ionotophoresis 4mg /ml Dexamethasone , Patient/Family education, Stair training, and Joint mobilization.  PLAN FOR NEXT SESSION: how has the bridging ex been and how did the lift L shoe work?  Reassess L hip strength for gluts, and progress as appropriate, manual techniques to improve lumbar mobility and L hip pain   Glendi Mohiuddin L Garison Genova, PT, DPT, OCS 10/08/2023, 5:02 PM

## 2023-10-22 ENCOUNTER — Other Ambulatory Visit: Payer: Self-pay

## 2023-10-22 ENCOUNTER — Ambulatory Visit

## 2023-10-22 DIAGNOSIS — M25552 Pain in left hip: Secondary | ICD-10-CM | POA: Diagnosis not present

## 2023-10-22 DIAGNOSIS — R293 Abnormal posture: Secondary | ICD-10-CM

## 2023-10-22 DIAGNOSIS — M5416 Radiculopathy, lumbar region: Secondary | ICD-10-CM

## 2023-10-22 NOTE — Therapy (Signed)
 OUTPATIENT PHYSICAL THERAPY THORACOLUMBAR TREATMENT   Patient Name: Alexandra Matthews MRN: [id number] DOB:Oct 17, 1950, 73 y.o., female Today's Date: 10/22/2023  END OF SESSION:  PT End of Session - 10/22/23 1557     Visit Number 3    Date for PT Re-Evaluation 11/26/23    Progress Note Due on Visit 10    PT Start Time 1600    PT Stop Time 1642    PT Time Calculation (min) 42 min    Activity Tolerance Patient tolerated treatment well    Behavior During Therapy [facility] for tasks assessed/performed            History reviewed. No pertinent past medical history. Past Surgical History:  Procedure Laterality Date   BACK SURGERY     LARYNGOSCOPY AND ESOPHAGOSCOPY N/A 07/18/2021   Procedure: DIRECT LARYNGOSCOPY MULTIPLE BIOPSIES;  Surgeon: Jesus Oliphant, MD;  Location: [facility] OR;  Service: ENT;  Laterality: N/A;   TONSILLECTOMY Bilateral 07/18/2021   Procedure: TONSILLECTOMY;  Surgeon: Jesus Oliphant, MD;  Location: [facility] OR;  Service: ENT;  Laterality: Bilateral;   Patient Active Problem List   Diagnosis Date Noted   S/P tonsillectomy 07/18/2021    PCP: Rexanne Ingle, MD  REFERRING PROVIDER: Mavis Purchase, MD  REFERRING DIAG: lumbar stenosis  Rationale for Evaluation and Treatment: Rehabilitation  THERAPY DIAG:  Radiculopathy, lumbar region  Pain in left hip  Abnormal posture  ONSET DATE: 6 months  SUBJECTIVE:                                                                                                                                                                                           SUBJECTIVE STATEMENT: 10/22/23: L hip overall better.  [place] over an hour several days in a  row. L hip improved pain and strength for climbing stairs.   Still a lot of trouble sleeping due to my back pain.  Have to partially sit up to sleep  Eval:My biggest problem is at night, I have a lot of sleep interference, also worse with sitting.  I can be active , dug a 25'  trench 2 days ago.  Have some numbness L leg.   PERTINENT HISTORY:  Progressive pain L post hip and post L thigh . Had fusion L 4/5 around 16 years ago. Referred to PT by neurosurgeon  PAIN:  Are you having pain? Yes: NPRS scale: 2 to 8/10 Pain location: L posterior gluteals, lower hip, L lumbar region Pain description: tingling L post hip with sitting prolonged,pain at night L post hip, lower back, weakness climbing up stairs  Aggravating factors: sitting, stair climbing,  sleep Relieving factors: walking, moving, pain meds  PRECAUTIONS: None  RED FLAGS: None   WEIGHT BEARING RESTRICTIONS: No  FALLS:  Has patient fallen in last 6 months? No  LIVING ENVIRONMENT: Lives with: lives alone Lives in: House/apartment Stairs: Yes: Internal: one flight  steps; on right going up Has following equipment at home: Single point cane  OCCUPATION: retired   PLOF: Independent  PATIENT GOALS: Be able to sleep, be able to avoid surgery  NEXT MD VISIT: in a few weeks sees another neurosurgeon for second opinion in the same practice  OBJECTIVE:  Note: Objective measures were completed at Evaluation unless otherwise noted.  DIAGNOSTIC FINDINGS:  Multifaceted lumbar stenosis   PATIENT SURVEYS:  Modified Oswestry Low Back Pain Disability Questionnaire: 11 / 50 = 22.0 %  COGNITION: Overall cognitive status: Within functional limits for tasks assessed     SENSATION: L post thigh parasthesia at night   MUSCLE LENGTH: Hamstrings: Right wnl deg; Left wnl deg Debby test: Right wnl deg; Left wnl deg  POSTURE: R iliac crest elevated greater than 1 cm on R, flattened lumbar region, loss of lordosis  PALPATION: Tender posterior hip Post jt line proximal femur   LUMBAR ROM: all lumbar ROM wnl , excessive, able to place palms on floor   LOWER EXTREMITY ROM:   pain with L hip ER terminal range , lacking 15 degrees All other LE ROM WNL  LOWER EXTREMITY MMT:  L hip extension 4-/5 with  lock bridge,  L hip flexion 4/5 B quads, hamstrings wfl, able to heel and toe walk wnl   LUMBAR SPECIAL TESTS:  Straight leg raise test: Negative, Slump test: Negative, Single leg stance test: Negative, and Thomas test: Negative  FUNCTIONAL TESTS:  Single leg stance 20 sec on each, no sway noted  Stair climbing, unable on L to forward step up to 4, 6 or 8 step without pain/ weakness   GAIT: Distance walked: in clinic, no deficits noted  TREATMENT DATE:  10/22/23:  Nustep Level 5 x 6 min Instructed in various stretches to relieve lower back pain and stiffness: Supine happy baby Prone press ups with therapist providing PA stabilization sacrum, 10 reps  Cat cow, to engage lower spine mobility Prone hangs over 65 cm physioball for traction lumbar region  Manual:  Lateral glide L hip, combined with ER stretch, 2 bouts 12 reps Side lying each side for deep tissue massage with theragun , L post hip jt and B glut max, B lumbar paraspinals.   10/08/23:   Manual;  Lateral glides of femur while stretching into L hip ER, 2 bouts of 15 reps in supine Side lying R for deep soft tissue cross friction massage L piriformis, gluteus medius, L superior posterior hip jt at tendinous attachments.   Therex: Pt demonstrated B bridging, with feet on 6 block she is performing once or 2 x day  Varied the intensity of bridging, utilized black t band around knees , maintaining isometric hip abd , for bridging with isolation of L hip abductor musculature.  Pt noted good engagement of her lat/ weak hip extensors L Also bridging with adductor squeeze with ease, so did not pursue this ex for home Attempted unilateral bridging but hamstring cramping so did not continue  Added supine hamstring/tfl/glut med stretch with yoga strap, t performed on each side  10/01/23: evaluation: one set of mobilization with movement for L hip capsule for L hip ER, lat glides L hip jt, 15 reps Instructed in  bridge, with feet on  elevated surface, used 6 step today on the table. Advised to perform up to 30 reps daily to isolate L glutes Provided with 1/8 insert L shoe to improve postural alignment.                                                                                                                                 PATIENT EDUCATION:  Education details: POC, goals  Person educated: Patient Education method: Explanation, Demonstration, Tactile cues, and Verbal cues Education comprehension: verbalized understanding, returned demonstration, and verbal cues required  HOME EXERCISE PROGRAM: TBD  ASSESSMENT:  CLINICAL IMPRESSION: 10/22/23: pt returned for 3rd physical therapy session, maintaining L hip ER ROM and strength,  today addressed more central lower back pain that is interfering with her sleep.  Utilizing a directional based approach, incorporated various lumber extension stretches to trial prior to sleep .  She tolerated well.  Will continue next week    Eval: Patient is a 73 y.o. 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 Assessment reveals some replication of her pain with L hip ER terminal range and weakness L hip extension, glut max and glut medius.  Tender over posterior aspect of L hip jt .  Also some postural changes with noticeable difference in iliac crest height , L LE lower.  She responded well today to initial education and instruction in strengthening for gluts.  Should benefit from skilled PT to address her deficits and improve her symptoms.  OBJECTIVE IMPAIRMENTS: decreased activity tolerance, decreased knowledge of condition, decreased strength, impaired perceived functional ability, postural dysfunction, and pain.   ACTIVITY LIMITATIONS: carrying, lifting, sitting, squatting, sleeping, stairs, and transfers  PARTICIPATION LIMITATIONS: cleaning, laundry, community activity, and yard work  PERSONAL FACTORS: Age,  Behavior pattern, Past/current experiences, Time since onset of injury/illness/exacerbation, and 1-2 comorbidities: h/o prior L4/5 fusion, h/o throat cancer are also affecting patient's functional outcome.   REHAB POTENTIAL: Good  CLINICAL DECISION MAKING: Evolving/moderate complexity  EVALUATION COMPLEXITY: Moderate   GOALS: Goals reviewed with patient? Yes  SHORT TERM GOALS: Target date: 2 weeks Oct 15 2023  I HEP Baseline: Goal status: INITIAL   LONG TERM GOALS: Target date: 8 weeks, 11/26/23  Able to climb 8 steps reciprocally without L post  hip pain Baseline: unable to perform reciprocally Goal status: INITIAL  2.  Modified oswestry improve from 22% deficit to 12% Baseline:  Goal status: INITIAL  3.  Strength L hip extension, improve from 4-/5 to 5/5 Baseline:  Goal status: INITIAL   PLAN:  PT FREQUENCY: 1-2x/week  PT DURATION: 8 weeks  PLANNED INTERVENTIONS: 97110-Therapeutic exercises, 97530- Therapeutic activity, [id number]- Neuromuscular re-education, 97535- Self Care, [id number]- Manual therapy, 97012- Traction (mechanical), [phone]- Ionotophoresis 4mg /ml Dexamethasone , Patient/Family education, Stair training, and Joint mobilization.  PLAN FOR NEXT SESSION:  Reassess L hip strength for gluts, and progress as appropriate,  manual techniques to improve lumbar mobility and L hip pain   Yerik Zeringue L Desirre Eickhoff, PT, DPT, OCS 10/22/2023, 4:53 PM

## 2023-10-29 ENCOUNTER — Ambulatory Visit

## 2023-10-29 ENCOUNTER — Other Ambulatory Visit: Payer: Self-pay

## 2023-10-29 DIAGNOSIS — R293 Abnormal posture: Secondary | ICD-10-CM | POA: Diagnosis not present

## 2023-10-29 DIAGNOSIS — M5416 Radiculopathy, lumbar region: Secondary | ICD-10-CM

## 2023-10-29 DIAGNOSIS — M25552 Pain in left hip: Secondary | ICD-10-CM | POA: Diagnosis not present

## 2023-10-29 NOTE — Therapy (Signed)
 OUTPATIENT PHYSICAL THERAPY THORACOLUMBAR TREATMENT   Patient Name: Catori Panozzo MRN: [id number] DOB:1950-08-15, 73 y.o., female Today's Date: 10/29/2023  END OF SESSION:  PT End of Session - 10/29/23 1657     Visit Number 4    Date for PT Re-Evaluation 11/26/23    Progress Note Due on Visit 10    PT Start Time 1600    PT Stop Time 1640    PT Time Calculation (min) 40 min             History reviewed. No pertinent past medical history. Past Surgical History:  Procedure Laterality Date   BACK SURGERY     LARYNGOSCOPY AND ESOPHAGOSCOPY N/A 07/18/2021   Procedure: DIRECT LARYNGOSCOPY MULTIPLE BIOPSIES;  Surgeon: Jesus Oliphant, MD;  Location: [facility] OR;  Service: ENT;  Laterality: N/A;   TONSILLECTOMY Bilateral 07/18/2021   Procedure: TONSILLECTOMY;  Surgeon: Jesus Oliphant, MD;  Location: [facility] OR;  Service: ENT;  Laterality: Bilateral;   Patient Active Problem List   Diagnosis Date Noted   S/P tonsillectomy 07/18/2021    PCP: Rexanne Ingle, MD  REFERRING PROVIDER: Mavis Purchase, MD  REFERRING DIAG: lumbar stenosis  Rationale for Evaluation and Treatment: Rehabilitation  THERAPY DIAG:  Radiculopathy, lumbar region  Pain in left hip  Abnormal posture  ONSET DATE: 6 months  SUBJECTIVE:                                                                                                                                                                                           SUBJECTIVE STATEMENT: 10/29/23: L hip overall better. Still hurts going up steps, lower back still painful at night, but I have been able to turn on my side and sleep instead of sleeping partially sitting upright.   Eval:My biggest problem is at night, I have a lot of sleep interference, also worse with sitting.  I can be active , dug a 25' trench 2 days ago.  Have some numbness L leg.   PERTINENT HISTORY:  Progressive pain L post hip and post L thigh . Had fusion L 4/5 around 16 years ago. Referred  to PT by neurosurgeon  PAIN:  Are you having pain? Yes: NPRS scale: 2 to 8/10 Pain location: L posterior gluteals, lower hip, L lumbar region Pain description: tingling L post hip with sitting prolonged,pain at night L post hip, lower back, weakness climbing up stairs  Aggravating factors: sitting, stair climbing, sleep Relieving factors: walking, moving, pain meds  PRECAUTIONS: None  RED FLAGS: None   WEIGHT BEARING RESTRICTIONS: No  FALLS:  Has patient fallen in last 6 months? No  LIVING ENVIRONMENT:  Lives with: lives alone Lives in: House/apartment Stairs: Yes: Internal: one flight  steps; on right going up Has following equipment at home: Single point cane  OCCUPATION: retired   PLOF: Independent  PATIENT GOALS: Be able to sleep, be able to avoid surgery  NEXT MD VISIT: in a few weeks sees another neurosurgeon for second opinion in the same practice  OBJECTIVE:  Note: Objective measures were completed at Evaluation unless otherwise noted.  DIAGNOSTIC FINDINGS:  Multifaceted lumbar stenosis   PATIENT SURVEYS:  Modified Oswestry Low Back Pain Disability Questionnaire: 11 / 50 = 22.0 %  COGNITION: Overall cognitive status: Within functional limits for tasks assessed     SENSATION: L post thigh parasthesia at night   MUSCLE LENGTH: Hamstrings: Right wnl deg; Left wnl deg Debby test: Right wnl deg; Left wnl deg  POSTURE: R iliac crest elevated greater than 1 cm on R, flattened lumbar region, loss of lordosis  PALPATION: Tender posterior hip Post jt line proximal femur   LUMBAR ROM: all lumbar ROM wnl , excessive, able to place palms on floor   LOWER EXTREMITY ROM:   pain with L hip ER terminal range , lacking 15 degrees All other LE ROM WNL  LOWER EXTREMITY MMT:  L hip extension 4-/5 with lock bridge,  L hip flexion 4/5 B quads, hamstrings wfl, able to heel and toe walk wnl   LUMBAR SPECIAL TESTS:  Straight leg raise test: Negative, Slump test:  Negative, Single leg stance test: Negative, and Thomas test: Negative  FUNCTIONAL TESTS:  Single leg stance 20 sec on each, no sway noted  Stair climbing, unable on L to forward step up to 4, 6 or 8 step without pain/ weakness   GAIT: Distance walked: in clinic, no deficits noted  TREATMENT DATE:  10/29/23:  Recumbent cycle x 6 min level 3 Supine assessed hip extension strength again, lock bridge L compared to R still weak, less control, 4-/5.    Manual: lateral distraction L prox hip combined with ER .   Sidelying R for deep tissue massage L post hip, lat hip musculature  Updated home program advanced strengthening L gluteals to high knee drivers with L foot on 2nd step, cues to stabilize with B hands Also trial of quadriped L donkey kicks  10/22/23:  Nustep Level 5 x 6 min Instructed in various stretches to relieve lower back pain and stiffness: Supine happy baby Prone press ups with therapist providing PA stabilization sacrum, 10 reps  Cat cow, to engage lower spine mobility Prone hangs over 65 cm physioball for traction lumbar region  Manual:  Lateral glide L hip, combined with ER stretch, 2 bouts 12 reps Side lying each side for deep tissue massage with theragun , L post hip jt and B glut max, B lumbar paraspinals.   10/08/23:   Manual;  Lateral glides of femur while stretching into L hip ER, 2 bouts of 15 reps in supine Side lying R for deep soft tissue cross friction massage L piriformis, gluteus medius, L superior posterior hip jt at tendinous attachments.   Therex: Pt demonstrated B bridging, with feet on 6 block she is performing once or 2 x day  Varied the intensity of bridging, utilized black t band around knees , maintaining isometric hip abd , for bridging with isolation of L hip abductor musculature.  Pt noted good engagement of her lat/ weak hip extensors L Also bridging with adductor squeeze with ease, so did not pursue this ex for  home Attempted unilateral  bridging but hamstring cramping so did not continue  Added supine hamstring/tfl/glut med stretch with yoga strap, t performed on each side  10/01/23: evaluation: one set of mobilization with movement for L hip capsule for L hip ER, lat glides L hip jt, 15 reps Instructed in bridge, with feet on elevated surface, used 6 step today on the table. Advised to perform up to 30 reps daily to isolate L glutes Provided with 1/8 insert L shoe to improve postural alignment.                                                                                                                                 PATIENT EDUCATION:  Education details: POC, goals  Person educated: Patient Education method: Explanation, Demonstration, Tactile cues, and Verbal cues Education comprehension: verbalized understanding, returned demonstration, and verbal cues required  HOME EXERCISE PROGRAM: TBD  ASSESSMENT:  CLINICAL IMPRESSION: 10/29/23: pt returned for 4th physical therapy session, maintaining L hip ER ROM and strength,  today focused on her L gluteal strength, progressed to more intensive strength, advised to perform to fatigue 3 x week.  Also discussed TENS as another means to manage her lower back pain.     Eval: Patient is a 73 y.o. 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 Assessment reveals some replication of her pain with L hip ER terminal range and weakness L hip extension, glut max and glut medius.  Tender over posterior aspect of L hip jt .  Also some postural changes with noticeable difference in iliac crest height , L LE lower.  She responded well today to initial education and instruction in strengthening for gluts.  Should benefit from skilled PT to address her deficits and improve her symptoms.  OBJECTIVE IMPAIRMENTS: decreased activity tolerance, decreased knowledge of condition, decreased strength, impaired perceived  functional ability, postural dysfunction, and pain.   ACTIVITY LIMITATIONS: carrying, lifting, sitting, squatting, sleeping, stairs, and transfers  PARTICIPATION LIMITATIONS: cleaning, laundry, community activity, and yard work  PERSONAL FACTORS: Age, Behavior pattern, Past/current experiences, Time since onset of injury/illness/exacerbation, and 1-2 comorbidities: h/o prior L4/5 fusion, h/o throat cancer are also affecting patient's functional outcome.   REHAB POTENTIAL: Good  CLINICAL DECISION MAKING: Evolving/moderate complexity  EVALUATION COMPLEXITY: Moderate   GOALS: Goals reviewed with patient? Yes  SHORT TERM GOALS: Target date: 2 weeks Oct 15 2023  I HEP Baseline: Goal status: INITIAL   LONG TERM GOALS: Target date: 8 weeks, 11/26/23  Able to climb 8 steps reciprocally without L post  hip pain Baseline: unable to perform reciprocally Goal status: INITIAL  2.  Modified oswestry improve from 22% deficit to 12% Baseline:  Goal status: INITIAL  3.  Strength L hip extension, improve from 4-/5 to 5/5 Baseline:  Goal status: INITIAL   PLAN:  PT FREQUENCY: 1-2x/week  PT DURATION: 8 weeks  PLANNED INTERVENTIONS: 97110-Therapeutic exercises, 97530- Therapeutic activity, [id number]- Neuromuscular re-education, 97535- Self Care, 02859- Manual therapy, [phone]- Traction (mechanical), [phone]- Ionotophoresis 4mg /ml Dexamethasone , Patient/Family education, Stair training, and Joint mobilization.  PLAN FOR NEXT SESSION:  Reassess L hip strength for gluts, and progress as appropriate, manual techniques to improve lumbar mobility and L hip pain. Reassess posture with the lift  shoe as well   Chaynce Schafer L Kerrin Markman, PT, DPT, OCS 10/29/2023, 4:59 PM

## 2023-11-05 ENCOUNTER — Ambulatory Visit

## 2023-11-10 ENCOUNTER — Ambulatory Visit: Attending: Internal Medicine

## 2023-11-10 ENCOUNTER — Other Ambulatory Visit: Payer: Self-pay

## 2023-11-10 DIAGNOSIS — R293 Abnormal posture: Secondary | ICD-10-CM | POA: Diagnosis not present

## 2023-11-10 DIAGNOSIS — M5416 Radiculopathy, lumbar region: Secondary | ICD-10-CM | POA: Insufficient documentation

## 2023-11-10 DIAGNOSIS — M25552 Pain in left hip: Secondary | ICD-10-CM | POA: Diagnosis not present

## 2023-11-10 NOTE — Therapy (Signed)
 OUTPATIENT PHYSICAL THERAPY THORACOLUMBAR TREATMENT   Patient Name: Alexandra Matthews MRN: [id number] DOB:Aug 25, 1950, 73 y.o., female Today's Date: 11/10/2023  END OF SESSION:  PT End of Session - 11/10/23 1704     Visit Number 5    Date for PT Re-Evaluation 11/26/23    Progress Note Due on Visit 10    PT Start Time 1515    PT Stop Time 1600    PT Time Calculation (min) 45 min    Activity Tolerance Patient tolerated treatment well    Behavior During Therapy [facility] for tasks assessed/performed              History reviewed. No pertinent past medical history. Past Surgical History:  Procedure Laterality Date   BACK SURGERY     LARYNGOSCOPY AND ESOPHAGOSCOPY N/A 07/18/2021   Procedure: DIRECT LARYNGOSCOPY MULTIPLE BIOPSIES;  Surgeon: Jesus Oliphant, MD;  Location: [facility] OR;  Service: ENT;  Laterality: N/A;   TONSILLECTOMY Bilateral 07/18/2021   Procedure: TONSILLECTOMY;  Surgeon: Jesus Oliphant, MD;  Location: [facility] OR;  Service: ENT;  Laterality: Bilateral;   Patient Active Problem List   Diagnosis Date Noted   S/P tonsillectomy 07/18/2021    PCP: Rexanne Ingle, MD  REFERRING PROVIDER: Mavis Purchase, MD  REFERRING DIAG: lumbar stenosis  Rationale for Evaluation and Treatment: Rehabilitation  THERAPY DIAG:  Radiculopathy, lumbar region  Pain in left hip  Abnormal posture  ONSET DATE: 6 months  SUBJECTIVE:                                                                                                                                                                                           SUBJECTIVE STATEMENT: 11/10/23: I have only taken 2 oxi since my last session with you, I have been moving granite and pea gravel by myself in the yard    Eval:My biggest problem is at night, I have a lot of sleep interference, also worse with sitting.  I can be active , dug a 25' trench 2 days ago.  Have some numbness L leg.   PERTINENT HISTORY:  Progressive pain L post hip and post L  thigh . Had fusion L 4/5 around 16 years ago. Referred to PT by neurosurgeon  PAIN:  Are you having pain? Yes: NPRS scale: 2 to 8/10 Pain location: L posterior gluteals, lower hip, L lumbar region Pain description: tingling L post hip with sitting prolonged,pain at night L post hip, lower back, weakness climbing up stairs  Aggravating factors: sitting, stair climbing, sleep Relieving factors: walking, moving, pain meds  PRECAUTIONS: None  RED FLAGS: None   WEIGHT BEARING RESTRICTIONS: No  FALLS:  Has patient fallen in last 6 months? No  LIVING ENVIRONMENT: Lives with: lives alone Lives in: House/apartment Stairs: Yes: Internal: one flight  steps; on right going up Has following equipment at home: Single point cane  OCCUPATION: retired   PLOF: Independent  PATIENT GOALS: Be able to sleep, be able to avoid surgery  NEXT MD VISIT: in a few weeks sees another neurosurgeon for second opinion in the same practice  OBJECTIVE:  Note: Objective measures were completed at Evaluation unless otherwise noted.  DIAGNOSTIC FINDINGS:  Multifaceted lumbar stenosis   PATIENT SURVEYS:  Modified Oswestry Low Back Pain Disability Questionnaire: 11 / 50 = 22.0 %  COGNITION: Overall cognitive status: Within functional limits for tasks assessed     SENSATION: L post thigh parasthesia at night   MUSCLE LENGTH: Hamstrings: Right wnl deg; Left wnl deg Debby test: Right wnl deg; Left wnl deg  POSTURE: R iliac crest elevated greater than 1 cm on R, flattened lumbar region, loss of lordosis  PALPATION: Tender posterior hip Post jt line proximal femur   LUMBAR ROM: all lumbar ROM wnl , excessive, able to place palms on floor   LOWER EXTREMITY ROM:   pain with L hip ER terminal range , lacking 15 degrees All other LE ROM WNL  LOWER EXTREMITY MMT:  L hip extension 4-/5 with lock bridge,  L hip flexion 4/5 B quads, hamstrings wfl, able to heel and toe walk wnl   LUMBAR SPECIAL  TESTS:  Straight leg raise test: Negative, Slump test: Negative, Single leg stance test: Negative, and Thomas test: Negative  FUNCTIONAL TESTS:  Single leg stance 20 sec on each, no sway noted  Stair climbing, unable on L to forward step up to 4, 6 or 8 step without pain/ weakness   GAIT: Distance walked: in clinic, no deficits noted  TREATMENT DATE:  11/10/23:  Recumbent cycle 6 min L 2  Supine for mob with movement for L hip ER, lateral glides L prox femur while performing L hip ER stretch. 3 bouts 10 reps each Side lying R for deep cross friction massage L posterior hip jt musculature, gluteals   Therex: leg press, B LE's, staggered with L foot forward, and L LE single leg, all 20# 3 sets  Wall slides with and without physioball, cues for correct positioning of feet away from wall to engage correct musculature Calf stretch with forefeet on 3 bar, cues to stretch ant hips B   10/29/23:  Recumbent cycle x 6 min level 3 Supine assessed hip extension strength again, lock bridge L compared to R still weak, less control, 4-/5.    Manual: lateral distraction L prox hip combined with ER .   Sidelying R for deep tissue massage L post hip, lat hip musculature  Updated home program advanced strengthening L gluteals to high knee drivers with L foot on 2nd step, cues to stabilize with B hands Also trial of quadriped L donkey kicks  10/22/23:  Nustep Level 5 x 6 min Instructed in various stretches to relieve lower back pain and stiffness: Supine happy baby Prone press ups with therapist providing PA stabilization sacrum, 10 reps  Cat cow, to engage lower spine mobility Prone hangs over 65 cm physioball for traction lumbar region  Manual:  Lateral glide L hip, combined with ER stretch, 2 bouts 12 reps Side lying each side for deep tissue massage with theragun , L post hip jt and B glut max, B lumbar paraspinals.  10/08/23:   Manual;  Lateral glides of femur while stretching into L hip  ER, 2 bouts of 15 reps in supine Side lying R for deep soft tissue cross friction massage L piriformis, gluteus medius, L superior posterior hip jt at tendinous attachments.   Therex: Pt demonstrated B bridging, with feet on 6 block she is performing once or 2 x day  Varied the intensity of bridging, utilized black t band around knees , maintaining isometric hip abd , for bridging with isolation of L hip abductor musculature.  Pt noted good engagement of her lat/ weak hip extensors L Also bridging with adductor squeeze with ease, so did not pursue this ex for home Attempted unilateral bridging but hamstring cramping so did not continue  Added supine hamstring/tfl/glut med stretch with yoga strap, t performed on each side  10/01/23: evaluation: one set of mobilization with movement for L hip capsule for L hip ER, lat glides L hip jt, 15 reps Instructed in bridge, with feet on elevated surface, used 6 step today on the table. Advised to perform up to 30 reps daily to isolate L glutes Provided with 1/8 insert L shoe to improve postural alignment.                                                                                                                                 PATIENT EDUCATION:  Education details: POC, goals  Person educated: Patient Education method: Explanation, Demonstration, Tactile cues, and Verbal cues Education comprehension: verbalized understanding, returned demonstration, and verbal cues required  HOME EXERCISE PROGRAM: Access Code: [email or web address] URL: [email or web address] Date: 11/10/2023 Prepared by: Greig Jeb Schloemer  Exercises - Wall Squat  - 1 x daily - 7 x weekly - 3 sets - 10 reps - Unilateral Wall Sit  - 1 x daily - 7 x weekly - 3 sets - 10 reps - Standing Bilateral Gastroc Stretch with Step  - 1 x daily - 7 x weekly - 3 sets - 10 reps  ASSESSMENT:  CLINICAL IMPRESSION: 11/10/23: pt returned for 5th physical therapy session, maintaining L hip ER ROM  and strength,  today focused on her L gluteal strength, progressed to more intensive strength, utilizing our equipment.  Much less inflamed L lateral and post hip/glut musculature.  Still some weakness/ asymmetry post L hip as compared to R, so we trialed other equipment,  exercises to help her to maintain her gluteals. She is ready for dc wants to follow up with utilizing our equipment after discharge.    Eval: Patient is a 73 y.o. 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 Assessment reveals some replication of her pain with L hip ER terminal range and weakness L hip extension, glut max and glut medius.  Tender over posterior aspect of L hip jt .  Also some postural changes with noticeable difference in iliac crest height ,  L LE lower.  She responded well today to initial education and instruction in strengthening for gluts.  Should benefit from skilled PT to address her deficits and improve her symptoms.  OBJECTIVE IMPAIRMENTS: decreased activity tolerance, decreased knowledge of condition, decreased strength, impaired perceived functional ability, postural dysfunction, and pain.   ACTIVITY LIMITATIONS: carrying, lifting, sitting, squatting, sleeping, stairs, and transfers  PARTICIPATION LIMITATIONS: cleaning, laundry, community activity, and yard work  PERSONAL FACTORS: Age, Behavior pattern, Past/current experiences, Time since onset of injury/illness/exacerbation, and 1-2 comorbidities: h/o prior L4/5 fusion, h/o throat cancer are also affecting patient's functional outcome.   REHAB POTENTIAL: Good  CLINICAL DECISION MAKING: Evolving/moderate complexity  EVALUATION COMPLEXITY: Moderate   GOALS: Goals reviewed with patient? Yes  SHORT TERM GOALS: Target date: 2 weeks Oct 15 2023  I HEP Baseline: Goal status: INITIAL   LONG TERM GOALS: Target date: 8 weeks, 11/26/23  Able to climb 8 steps reciprocally  without L post  hip pain Baseline: unable to perform reciprocally Goal status: 11/10/23; able to perform reciprocally, fatigues/sore L gluts  2.  Modified oswestry improve from 22% deficit to 12% Baseline:  Goal status: 11/10/23: 10% today, goal met  3.  Strength L hip extension, improve from 4-/5 to 5/5 Baseline:  Goal status: 11/10/23:4+/5 with lock bridge    PLAN:  PT FREQUENCY: 1-2x/week  PT DURATION: 8 weeks  PLANNED INTERVENTIONS: 97110-Therapeutic exercises, 97530- Therapeutic activity, [id number]- Neuromuscular re-education, 97535- Self Care, [id number]- Manual therapy, 97012- Traction (mechanical), [id number]- Ionotophoresis 4mg /ml Dexamethasone , Patient/Family education, Stair training, and Joint mobilization.  PLAN FOR NEXT SESSION:  DC goals met  Jessia Kief L Ubaldo Daywalt, PT, DPT, OCS 11/10/2023, 5:10 PM

## 2023-11-18 DIAGNOSIS — Z8589 Personal history of malignant neoplasm of other organs and systems: Secondary | ICD-10-CM | POA: Diagnosis not present

## 2023-11-18 DIAGNOSIS — Z08 Encounter for follow-up examination after completed treatment for malignant neoplasm: Secondary | ICD-10-CM | POA: Diagnosis not present

## 2023-11-18 DIAGNOSIS — Z79890 Hormone replacement therapy (postmenopausal): Secondary | ICD-10-CM | POA: Diagnosis not present

## 2023-11-18 DIAGNOSIS — R590 Localized enlarged lymph nodes: Secondary | ICD-10-CM | POA: Diagnosis not present

## 2023-11-18 DIAGNOSIS — C099 Malignant neoplasm of tonsil, unspecified: Secondary | ICD-10-CM | POA: Diagnosis not present

## 2023-11-18 DIAGNOSIS — Z9221 Personal history of antineoplastic chemotherapy: Secondary | ICD-10-CM | POA: Diagnosis not present

## 2023-11-18 DIAGNOSIS — E039 Hypothyroidism, unspecified: Secondary | ICD-10-CM | POA: Diagnosis not present

## 2023-11-18 DIAGNOSIS — Z85818 Personal history of malignant neoplasm of other sites of lip, oral cavity, and pharynx: Secondary | ICD-10-CM | POA: Diagnosis not present

## 2023-11-18 DIAGNOSIS — R599 Enlarged lymph nodes, unspecified: Secondary | ICD-10-CM | POA: Diagnosis not present

## 2023-11-18 DIAGNOSIS — Z923 Personal history of irradiation: Secondary | ICD-10-CM | POA: Diagnosis not present

## 2023-11-24 DIAGNOSIS — K1370 Unspecified lesions of oral mucosa: Secondary | ICD-10-CM | POA: Diagnosis not present

## 2023-11-26 ENCOUNTER — Ambulatory Visit

## 2023-11-27 DIAGNOSIS — R911 Solitary pulmonary nodule: Secondary | ICD-10-CM | POA: Diagnosis not present

## 2023-11-27 DIAGNOSIS — J984 Other disorders of lung: Secondary | ICD-10-CM | POA: Diagnosis not present

## 2023-11-27 DIAGNOSIS — R918 Other nonspecific abnormal finding of lung field: Secondary | ICD-10-CM | POA: Diagnosis not present

## 2023-11-27 DIAGNOSIS — N2889 Other specified disorders of kidney and ureter: Secondary | ICD-10-CM | POA: Diagnosis not present

## 2023-11-27 DIAGNOSIS — Z85818 Personal history of malignant neoplasm of other sites of lip, oral cavity, and pharynx: Secondary | ICD-10-CM | POA: Diagnosis not present

## 2023-11-27 DIAGNOSIS — M479 Spondylosis, unspecified: Secondary | ICD-10-CM | POA: Diagnosis not present

## 2023-11-27 DIAGNOSIS — E041 Nontoxic single thyroid nodule: Secondary | ICD-10-CM | POA: Diagnosis not present

## 2023-11-27 DIAGNOSIS — K7689 Other specified diseases of liver: Secondary | ICD-10-CM | POA: Diagnosis not present

## 2023-11-27 DIAGNOSIS — K5730 Diverticulosis of large intestine without perforation or abscess without bleeding: Secondary | ICD-10-CM | POA: Diagnosis not present

## 2023-11-28 DIAGNOSIS — H524 Presbyopia: Secondary | ICD-10-CM | POA: Diagnosis not present

## 2023-11-28 DIAGNOSIS — H5203 Hypermetropia, bilateral: Secondary | ICD-10-CM | POA: Diagnosis not present

## 2023-11-28 DIAGNOSIS — H2513 Age-related nuclear cataract, bilateral: Secondary | ICD-10-CM | POA: Diagnosis not present

## 2023-11-28 DIAGNOSIS — H40013 Open angle with borderline findings, low risk, bilateral: Secondary | ICD-10-CM | POA: Diagnosis not present

## 2023-11-28 DIAGNOSIS — H353131 Nonexudative age-related macular degeneration, bilateral, early dry stage: Secondary | ICD-10-CM | POA: Diagnosis not present

## 2023-11-28 DIAGNOSIS — H04123 Dry eye syndrome of bilateral lacrimal glands: Secondary | ICD-10-CM | POA: Diagnosis not present

## 2023-12-04 DIAGNOSIS — H2513 Age-related nuclear cataract, bilateral: Secondary | ICD-10-CM | POA: Diagnosis not present

## 2023-12-04 DIAGNOSIS — H43812 Vitreous degeneration, left eye: Secondary | ICD-10-CM | POA: Diagnosis not present

## 2023-12-04 DIAGNOSIS — H35353 Cystoid macular degeneration, bilateral: Secondary | ICD-10-CM | POA: Diagnosis not present

## 2023-12-04 DIAGNOSIS — H43821 Vitreomacular adhesion, right eye: Secondary | ICD-10-CM | POA: Diagnosis not present

## 2023-12-30 IMAGING — PT NM PET TUM IMG INITIAL (PI) SKULL BASE T - THIGH
1 of 7 series · 2 of 25 positions shown · non-contrast
Comparison: Noncontrast neck CT 05/24/2021

CLINICAL DATA: Initial treatment strategy for metastatic neck
adenopathy.

EXAM:
NUCLEAR MEDICINE PET SKULL BASE TO THIGH
TECHNIQUE: 6.89 mCi F-18 FDG was injected intravenously. Full-ring PET imaging
was performed from the skull base to thigh after the radiotracer. CT
data was obtained and used for attenuation correction and anatomic
localization.
Fasting blood glucose: 86 mg/dl

[Series 4: ct [id number]_sk_th 5.0 br38 · axial · 5.0mm · 0.98mm/px · z∈[+24,+204]mm · 2 of 225 slices shown]
[im 180/225  brain]
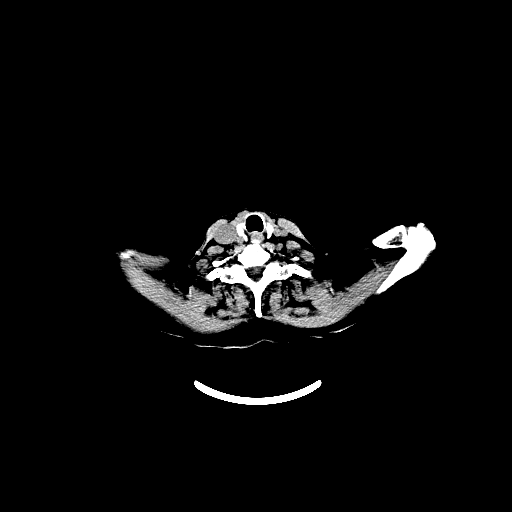
[im 225/225  brain]
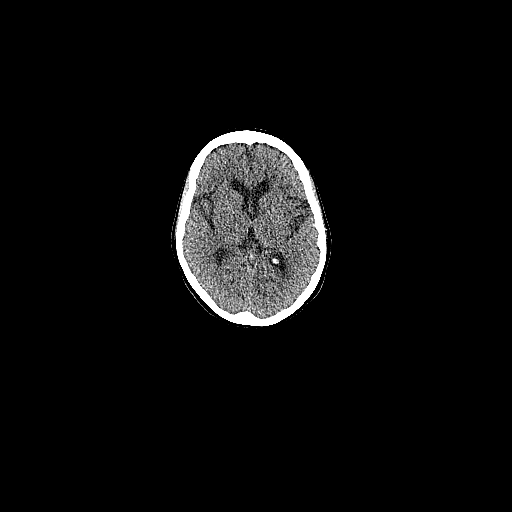

[2 of 25 positions shown; findings below may reference images not displayed]

FINDINGS: Mediastinal blood pool activity: SUV max

Liver activity: SUV max NA

NECK: The cystic and solid appearing right neck mass is
hypermetabolic with SUV max of 15.73. This is most consistent with a
metastatic level 2 lymph node. No other enlarged or hypermetabolic
neck nodes are identified. No evidence of a primary mucosal lesion.
There is moderate symmetric uptake in the tonsillar regions
bilaterally which is likely reactive lymphoid tissue.

Incidental CT findings: none

CHEST: No hypermetabolic mediastinal or hilar nodes. No suspicious
pulmonary nodules on the CT scan.

No hypermetabolic breast lesions, supraclavicular or axillary
adenopathy.

Incidental CT findings: Moderate pulmonary scarring changes and
breathing motion artifact but no worrisome pulmonary lesions or
nodules. Biapical pleural and parenchymal scarring changes are
noted.

Scattered aortic calcifications at the aortic arch. No obvious
coronary artery calcifications.

ABDOMEN/PELVIS: No abnormal hypermetabolic activity within the
liver, pancreas, adrenal glands, or spleen. No hypermetabolic lymph
nodes in the abdomen or pelvis.

Incidental CT findings: Small scattered low-attenuation hepatic
lesions consistent with benign cysts.

Scattered aortic calcifications but no aneurysm.

SKELETON: No focal hypermetabolic activity to suggest skeletal
metastasis.

Incidental CT findings: none
IMPRESSION: 1. The right neck nodal mass is markedly hypermetabolic and
consistent with known neoplasm.
2. No primary mucosal lesion is identified.
3. No findings for metastatic disease involving the chest, abdomen,
pelvis or bony structures.
4. Incidental findings as detailed above.

## 2024-01-13 DIAGNOSIS — E7800 Pure hypercholesterolemia, unspecified: Secondary | ICD-10-CM | POA: Diagnosis not present

## 2024-01-13 DIAGNOSIS — Z23 Encounter for immunization: Secondary | ICD-10-CM | POA: Diagnosis not present

## 2024-01-13 DIAGNOSIS — M545 Low back pain, unspecified: Secondary | ICD-10-CM | POA: Diagnosis not present

## 2024-01-13 DIAGNOSIS — Z0000 Encounter for general adult medical examination without abnormal findings: Secondary | ICD-10-CM | POA: Diagnosis not present

## 2024-01-13 DIAGNOSIS — M8580 Other specified disorders of bone density and structure, unspecified site: Secondary | ICD-10-CM | POA: Diagnosis not present

## 2024-01-13 DIAGNOSIS — E039 Hypothyroidism, unspecified: Secondary | ICD-10-CM | POA: Diagnosis not present

## 2024-01-13 DIAGNOSIS — C099 Malignant neoplasm of tonsil, unspecified: Secondary | ICD-10-CM | POA: Diagnosis not present

## 2024-01-13 DIAGNOSIS — Z5181 Encounter for therapeutic drug level monitoring: Secondary | ICD-10-CM | POA: Diagnosis not present

## 2024-01-14 ENCOUNTER — Other Ambulatory Visit: Payer: Self-pay | Admitting: Internal Medicine

## 2024-01-14 DIAGNOSIS — Z1231 Encounter for screening mammogram for malignant neoplasm of breast: Secondary | ICD-10-CM

## 2024-01-16 DIAGNOSIS — Z1211 Encounter for screening for malignant neoplasm of colon: Secondary | ICD-10-CM | POA: Diagnosis not present

## 2024-01-20 DIAGNOSIS — M48061 Spinal stenosis, lumbar region without neurogenic claudication: Secondary | ICD-10-CM | POA: Diagnosis not present

## 2024-01-22 DIAGNOSIS — M85832 Other specified disorders of bone density and structure, left forearm: Secondary | ICD-10-CM | POA: Diagnosis not present

## 2024-01-22 DIAGNOSIS — M8589 Other specified disorders of bone density and structure, multiple sites: Secondary | ICD-10-CM | POA: Diagnosis not present

## 2024-01-23 LAB — COLOGUARD: COLOGUARD: POSITIVE — AB

## 2024-02-04 ENCOUNTER — Other Ambulatory Visit: Payer: Self-pay | Admitting: Medical Genetics

## 2024-02-11 ENCOUNTER — Ambulatory Visit
Admission: RE | Admit: 2024-02-11 | Discharge: 2024-02-11 | Disposition: A | Discharge status: Home / Self Care | Source: Ambulatory Visit | Attending: Internal Medicine | Admitting: Internal Medicine

## 2024-02-11 DIAGNOSIS — Z1231 Encounter for screening mammogram for malignant neoplasm of breast: Secondary | ICD-10-CM

## 2024-04-08 ENCOUNTER — Other Ambulatory Visit: Payer: Self-pay | Admitting: Medical Genetics

## 2024-04-08 DIAGNOSIS — Z006 Encounter for examination for normal comparison and control in clinical research program: Secondary | ICD-10-CM
